# Patient Record
Sex: Male | Born: 1960 | Race: Black or African American | Hispanic: No | Marital: Married | Smoking: Former smoker
Health system: Southern US, Community
[De-identification: ages and names within clinical notes are randomized; demographics above are authoritative.]

## PROBLEM LIST (undated history)

## (undated) DIAGNOSIS — M1990 Unspecified osteoarthritis, unspecified site: Secondary | ICD-10-CM

## (undated) DIAGNOSIS — J189 Pneumonia, unspecified organism: Secondary | ICD-10-CM

## (undated) DIAGNOSIS — I10 Essential (primary) hypertension: Secondary | ICD-10-CM

## (undated) DIAGNOSIS — F329 Major depressive disorder, single episode, unspecified: Secondary | ICD-10-CM

## (undated) DIAGNOSIS — F419 Anxiety disorder, unspecified: Secondary | ICD-10-CM

## (undated) DIAGNOSIS — K219 Gastro-esophageal reflux disease without esophagitis: Secondary | ICD-10-CM

## (undated) DIAGNOSIS — F32A Depression, unspecified: Secondary | ICD-10-CM

## (undated) HISTORY — PX: ANKLE SURGERY: SHX546

## (undated) HISTORY — PX: PELVIC FRACTURE SURGERY: SHX119

---

## 1997-12-09 ENCOUNTER — Encounter: Payer: Self-pay | Admitting: Emergency Medicine

## 1997-12-10 ENCOUNTER — Inpatient Hospital Stay (HOSPITAL_COMMUNITY): Admission: EM | Admit: 1997-12-10 | Discharge: 1997-12-11 | Payer: Self-pay | Admitting: Emergency Medicine

## 1997-12-10 ENCOUNTER — Encounter: Payer: Self-pay | Admitting: Internal Medicine

## 2004-07-03 ENCOUNTER — Ambulatory Visit: Payer: Self-pay | Admitting: Physical Medicine and Rehabilitation

## 2004-07-03 ENCOUNTER — Encounter
Admission: RE | Admit: 2004-07-03 | Discharge: 2004-10-09 | Payer: Self-pay | Admitting: Physical Medicine and Rehabilitation

## 2005-07-30 ENCOUNTER — Inpatient Hospital Stay (HOSPITAL_COMMUNITY): Admission: EM | Admit: 2005-07-30 | Discharge: 2005-08-05 | Payer: Self-pay | Admitting: Emergency Medicine

## 2005-08-03 ENCOUNTER — Ambulatory Visit: Payer: Self-pay | Admitting: Physical Medicine & Rehabilitation

## 2005-08-04 ENCOUNTER — Encounter: Payer: Self-pay | Admitting: Vascular Surgery

## 2005-08-05 ENCOUNTER — Inpatient Hospital Stay (HOSPITAL_COMMUNITY)
Admission: RE | Admit: 2005-08-05 | Discharge: 2005-08-14 | Payer: Self-pay | Admitting: Physical Medicine & Rehabilitation

## 2005-08-15 ENCOUNTER — Emergency Department (HOSPITAL_COMMUNITY): Admission: EM | Admit: 2005-08-15 | Discharge: 2005-08-15 | Payer: Self-pay | Admitting: Emergency Medicine

## 2005-09-01 ENCOUNTER — Encounter
Admission: RE | Admit: 2005-09-01 | Discharge: 2005-11-30 | Payer: Self-pay | Admitting: Physical Medicine & Rehabilitation

## 2005-09-01 ENCOUNTER — Ambulatory Visit: Payer: Self-pay | Admitting: Physical Medicine & Rehabilitation

## 2005-09-16 ENCOUNTER — Encounter: Admission: RE | Admit: 2005-09-16 | Discharge: 2005-10-21 | Payer: Self-pay | Admitting: Orthopedic Surgery

## 2005-10-06 ENCOUNTER — Ambulatory Visit: Payer: Self-pay | Admitting: Physical Medicine & Rehabilitation

## 2005-11-03 ENCOUNTER — Ambulatory Visit: Payer: Self-pay | Admitting: Physical Medicine & Rehabilitation

## 2005-12-01 ENCOUNTER — Encounter
Admission: RE | Admit: 2005-12-01 | Discharge: 2006-03-01 | Payer: Self-pay | Admitting: Physical Medicine & Rehabilitation

## 2005-12-30 ENCOUNTER — Ambulatory Visit: Payer: Self-pay | Admitting: Physical Medicine & Rehabilitation

## 2006-01-28 ENCOUNTER — Encounter
Admission: RE | Admit: 2006-01-28 | Discharge: 2006-04-28 | Payer: Self-pay | Admitting: Physical Medicine & Rehabilitation

## 2006-02-18 ENCOUNTER — Ambulatory Visit: Payer: Self-pay | Admitting: Physical Medicine & Rehabilitation

## 2006-04-13 ENCOUNTER — Ambulatory Visit: Payer: Self-pay | Admitting: Physical Medicine & Rehabilitation

## 2006-05-14 ENCOUNTER — Ambulatory Visit: Payer: Self-pay | Admitting: Physical Medicine & Rehabilitation

## 2006-05-14 ENCOUNTER — Encounter
Admission: RE | Admit: 2006-05-14 | Discharge: 2006-08-12 | Payer: Self-pay | Admitting: Physical Medicine & Rehabilitation

## 2006-06-11 ENCOUNTER — Ambulatory Visit: Payer: Self-pay | Admitting: Physical Medicine & Rehabilitation
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
## 2010-06-13 NOTE — Assessment & Plan Note (Signed)
Ebert is back is regarding his chronic back pain and polytrauma with  sciatic nerve injury.  Shawntez continues to improve.  He is using minimal  morphine sulfate immediate-release now, only taking it at nighttime as  needed.  He is using his MS Contin 60 mg q.12 h.  He is on Lyrica 75 mg  t.i.d.  His pain is 6/10 to 7/10.  It is generally in the low back with  minimal pain into the legs.  Pain is worse with walking and standing for  prolonged periods of time.  Pain generally improves with rest, heat, and  exercise.  The cool weather has tightened his back up a bit.  He uses his  Robaxin on as needed for spasm.   REVIEW OF SYSTEMS:  The patient reports some anxiety.  Denies any new  neurologic, constitutional, GU, GI, or cardiorespiratory complaints,  otherwise.   SOCIAL HISTORY:  Patient is married.  Still smokes 1-1/2 packs of cigarettes  a day.   PHYSICAL EXAMINATION:  VITAL SIGNS:  Blood pressure is 144/74, pulse 95,  respiratory rate 16.  He is satting 95% on room air.  GENERAL:  Patient is pleasant, in no acute distress.  NEUROLOGIC:  He is alert and oriented x3.  Affect is bright and appropriate.  Gait is slightly wide-based but steady.  Coordination is fair.  Reflexes are  1+ in the legs.  Sensation appears to be grossly intact in both legs.  Strength is near 4+/5 to 5/5 in both legs today.  He is a bit limited in  lumbar flexion only to about 45 degrees with tightness.  Extension caused  some discomfort today at about 10-15 degrees.  Rotation and lateral bending  were approximately 20 degrees to either side.  Straigtht leg testing was  negative today.  He was a bit tight still on his upper legs, particularly in  the hamstring musculature.  Mood was good.  No anxiety was appreciated  today.   ASSESSMENT:  1. Polytrauma, including multiple lumbar transverse process fractures,      sciatic nerve injury and pelvic fractures.  2. Anxiety disorder.   PLAN:  1. Continue  tapering MS Contin.  We will decrease the MS Contin to 30 mg      q.12 h. #60 today.  The patient has MSIR remaining at home for      breakthrough pain, which he is using minimally.  2. Begin Naprelan 375 mg 2 tablets daily as anti-inflammatory.  Patient      will take with food.  Observe closely for GI side effects.  3. Lyrica 75 mg t.i.d. for now.  4. Robaxin p.r.n. for spasm.  5. Encourage ongoing stretching, range of motion, and aerobic activity.  6. I will see the patient back in about 2 months' time.  He will see the      nurse in the clinic in 1 month.      Ranelle Oyster, M.D.  Electronically Signed     ZTS/MedQ  D:  12/02/2005 09:28:15  T:  12/02/2005 14:39:37  Job #:  [id number]
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
## 2010-06-13 NOTE — Assessment & Plan Note (Signed)
Jeremiah Lopez is back regarding his low back pain.  He is generally improving  to a great extent with the back.  He still has a lot of pain when he  walks or stands for prolonged periods of time.  The pain seems to be  most prominent in the right low back area above the pelvis.  He rates  the pan as a 9/10 and describes it as sharp, burning, stabbing and  constant.  Pain interferes with general activity, relations with others  and enjoyment of life on a moderate level.  Sleep is fair-to-good.  Pain  improves with his medication as well as heat and ice.   MEDICATIONS:  He remains on MS Contin 30 mg q.12 hours, oxycodone 5 mg 1  to 2 q.6 hours p.r.n.  I have also had him on Naprelan 375 mg 2 daily.  He has Lidoderm patches but uses them sparingly.   REVIEW OF SYSTEMS:  Notable for some weight gain.  Other pertinent  positives are listed above.  His blood pressure has been a little bit  high at times as well as.  Full review is in the written health and  history section.  The patient reports continued erectile dysfunction.   SOCIAL HISTORY:  The patient is married and he and his wife recently  moved to [facility].  He is looking for a pain clinic in  [place] to see him.   PHYSICAL EXAMINATION:  VITAL SIGNS:  Blood pressure is 163/85, pulse is  80, respiratory rate 16, he is sating 99% on room air.  GENERAL:  The patient is generally pleasant and alert and oriented x3.  Weight is generally stable.  He has fair posture.  He had difficulty  bending but was able to flex at the waist near 85 degrees to 90 degrees.  He had problems moving back to extended position.  He had problems with  extension from a neutral position as well as facet maneuver particularly  to the right.  The right flank was tender with palpation near the  transverse processes and perhaps the facets.  Motor function was near  5/5.  Sensory exam was near normal today.  Cognitively he was  appropriate.  No anxiety  was seen.  HEART:  Regular.  CHEST:  Clear.  ABDOMEN:  Soft, nontender.   ASSESSMENT:  1. Polytrauma:  Multiple lumbar transverse process fractures on the      right, sciatic nerve injury and pelvic fractures.  2. Possible facet syndrome in lumbar spine.  3. Anxiety disorder.   PLAN:  1. MS Contin 30 mg q.12 hours, oxycodone 5 mg 1 to 2 q.6 hours p.r.n.  2. Refilled Benicar 25/40 for hypertension.  3. Maintain Lyrica and Robaxin.  Encouraged increased use of Lidoderm      patches for low back as well as regular exercise and activity.  4. Gave the patient a prescription for Viagra 50 mg 1 q. day p.r.n.  5. The patient will seek a pain center in [facility].  I      would be happy to facilitate transfer of his care there as needed.     Ranelle Oyster, M.D.  Electronically Signed    ZTS/MedQ  D:  05/18/2006 10:04:00  T:  05/18/2006 10:36:45  Job #:  [id number]
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
## 2017-06-16 ENCOUNTER — Ambulatory Visit (INDEPENDENT_AMBULATORY_CARE_PROVIDER_SITE_OTHER): Payer: Medicare HMO

## 2017-06-16 ENCOUNTER — Encounter (HOSPITAL_COMMUNITY): Payer: Self-pay | Admitting: Emergency Medicine

## 2017-06-16 ENCOUNTER — Ambulatory Visit (HOSPITAL_COMMUNITY)
Admission: EM | Admit: 2017-06-16 | Discharge: 2017-06-16 | Disposition: A | Discharge status: Home / Self Care | Payer: Medicare HMO | Attending: Family Medicine | Admitting: Family Medicine

## 2017-06-16 DIAGNOSIS — J069 Acute upper respiratory infection, unspecified: Secondary | ICD-10-CM

## 2017-06-16 DIAGNOSIS — H6690 Otitis media, unspecified, unspecified ear: Secondary | ICD-10-CM

## 2017-06-16 HISTORY — DX: Major depressive disorder, single episode, unspecified: F32.9

## 2017-06-16 HISTORY — DX: Essential (primary) hypertension: I10

## 2017-06-16 HISTORY — DX: Depression, unspecified: F32.A

## 2017-06-16 HISTORY — DX: Anxiety disorder, unspecified: F41.9

## 2017-06-16 MED ORDER — ALBUTEROL SULFATE HFA 108 (90 BASE) MCG/ACT IN AERS: 1.0000 | INHALATION_SPRAY | Freq: Four times a day (QID) | RESPIRATORY_TRACT | 0 refills | Status: DC | PRN

## 2017-06-16 MED ORDER — FLUTICASONE PROPIONATE 50 MCG/ACT NA SUSP: 1.0000 | Freq: Every day | NASAL | 0 refills | Status: AC

## 2017-06-16 MED ORDER — AMOXICILLIN 500 MG PO CAPS: 500.0000 mg | ORAL_CAPSULE | Freq: Three times a day (TID) | ORAL | 0 refills | Status: DC

## 2017-06-16 MED ORDER — BENZONATATE 200 MG PO CAPS
200.0000 mg | ORAL_CAPSULE | Freq: Three times a day (TID) | ORAL | 0 refills | Status: DC
Start: 2017-06-16 — End: 2020-11-08

## 2017-06-16 NOTE — ED Triage Notes (Signed)
Pt c/o fever, chills body aches, ear pain, headache, cough for several days.

## 2017-06-16 NOTE — ED Provider Notes (Signed)
[facility]    CSN: [id number] Arrival date & time: 06/16/17  1621     History   Chief Complaint Chief Complaint  Patient presents with  . Fever  . Generalized Body Aches    HPI Jeremiah Lopez is a 57 y.o. male history of hypertension, previous smoker presenting today for evaluation of URI symptoms.  Patient has been having fever, chills, body aches, right ear pain, headache and cough and congestion.  Symptoms have been going on for approximately 3 to 4 days.  Ear feels like it is popped.  States he is feeling similar to when he previously had pneumonia.  Fever up to 100.4.  Taking Alka-Seltzer plus as well as cough drops.  HPI  Past Medical History:  Diagnosis Date  . Anxiety   . Depression   . Hypertension     There are no active problems to display for this patient.   Past Surgical History:  Procedure Laterality Date  . ANKLE SURGERY    . PELVIC FRACTURE SURGERY         Home Medications    Prior to Admission medications   Medication Sig Start Date End Date Taking? Authorizing Provider  alprazolam Prudy Feeler) 2 MG tablet Take 2 mg by mouth at bedtime as needed for sleep.   Yes [provider]  cyclobenzaprine (FLEXERIL) 10 MG tablet Take 10 mg by mouth 3 (three) times daily as needed for muscle spasms.   Yes [provider]  lamoTRIgine (LAMICTAL) 25 MG tablet Take 25 mg by mouth daily.   Yes [provider]  losartan-hydrochlorothiazide (HYZAAR) 100-25 MG tablet Take 1 tablet by mouth daily.   Yes [provider]  albuterol (PROVENTIL HFA;VENTOLIN HFA) 108 (90 Base) MCG/ACT inhaler Inhale 1-2 puffs into the lungs every 6 (six) hours as needed for wheezing or shortness of breath. 06/16/17   Benna Arno C, PA-C  amoxicillin (AMOXIL) 500 MG capsule Take 1 capsule (500 mg total) by mouth 3 (three) times daily for 10 days. 06/16/17 06/26/17  Eathan Groman C, PA-C  benzonatate (TESSALON) 200 MG capsule Take 1 capsule  (200 mg total) by mouth every 8 (eight) hours. 06/16/17   Caro Brundidge C, PA-C  fluticasone (FLONASE) 50 MCG/ACT nasal spray Place 1-2 sprays into both nostrils daily for 7 days. 06/16/17 06/23/17  Micaella Gitto, Junius Creamer, PA-C    Family History Family History  Problem Relation Age of Onset  . Hypertension Mother   . Hypertension Father     Social History Social History   Tobacco Use  . Smoking status: Never Smoker  Substance Use Topics  . Alcohol use: Yes  . Drug use: Not on file     Allergies   Bee venom   Review of Systems Review of Systems  Constitutional: Positive for fatigue and fever. Negative for activity change and appetite change.  HENT: Positive for congestion, ear pain, postnasal drip, rhinorrhea, sinus pressure and sore throat.   Eyes: Negative for pain and itching.  Respiratory: Positive for cough. Negative for shortness of breath.   Cardiovascular: Negative for chest pain.  Gastrointestinal: Negative for abdominal pain, diarrhea, nausea and vomiting.  Musculoskeletal: Negative for myalgias.  Skin: Negative for rash.  Neurological: Positive for headaches. Negative for dizziness and light-headedness.     Physical Exam Triage Vital Signs ED Triage Vitals [06/16/17 1632]  Enc Vitals Group     BP (!) 154/78     Pulse Rate 100     Resp (!) 22  Temp 99.3 F (37.4 C)     Temp src      SpO2 98 %     Weight      Height      Head Circumference      Peak Flow      Pain Score      Pain Loc      Pain Edu?      Excl. in GC?    No data found.  Updated Vital Signs BP (!) 154/78   Pulse 100   Temp 99.3 F (37.4 C)   Resp (!) 22   SpO2 98%   Visual Acuity Right Eye Distance:   Left Eye Distance:   Bilateral Distance:    Right Eye Near:   Left Eye Near:    Bilateral Near:     Physical Exam  Constitutional: He appears well-developed and well-nourished.  HENT:  Head: Normocephalic and atraumatic.  Bilateral ears without tenderness to palpation  of external auricle, tragus and mastoid, EAC's without erythema or swelling; right TM erythematous and dull to superior aspect of the TM, left TM nonerythematous.  Oral mucosa pink and moist, no tonsillar enlargement or exudate. Posterior pharynx patent and erythematous, no uvula deviation or swelling. Normal phonation.   Eyes: Conjunctivae are normal.  Neck: Neck supple.  Cardiovascular: Normal rate and regular rhythm.  No murmur heard. Pulmonary/Chest: Effort normal and breath sounds normal. No respiratory distress.  Breathing comfortably at rest, CTABL, no wheezing, rales or other adventitious sounds auscultated  Abdominal: Soft. There is no tenderness.  Musculoskeletal: He exhibits no edema.  Neurological: He is alert.  Skin: Skin is warm and dry.  Psychiatric: He has a normal mood and affect.  Nursing note and vitals reviewed.    UC Treatments / Results  Labs (all labs ordered are listed, but only abnormal results are displayed) Labs Reviewed - No data to display  EKG None  Radiology Dg Chest 2 View  Result Date: 06/16/2017 CLINICAL DATA:  Cough, fever EXAM: CHEST - 2 VIEW COMPARISON:  04/25/2015 FINDINGS: Peribronchial thickening and interstitial prominence, likely bronchitis. Heart is normal size. No confluent opacities or effusions. No acute bony abnormality. IMPRESSION: Bronchitic changes. Electronically Signed   By: Charlett Nose M.D.   On: 06/16/2017 18:29    Procedures Procedures (including critical care time)  Medications Ordered in UC Medications - No data to display  Initial Impression / Assessment and Plan / UC Course  I have reviewed the triage vital signs and the nursing notes.  Pertinent labs & imaging results that were available during my care of the patient were reviewed by me and considered in my medical decision making (see chart for details).     Chest x-ray negative for pneumonia.  Does show bronchitic changes, likely from previous smoking.   Patient with right otitis media, other symptoms likely viral.  We will go and initiate amoxicillin.  We will add in Flonase for nasal congestion, Tessalon for cough and albuterol inhaler as needed.  Tylenol and ibuprofen for fever.Discussed strict return precautions. Patient verbalized understanding and is agreeable with plan.  Final Clinical Impressions(s) / UC Diagnoses   Final diagnoses:  Viral upper respiratory tract infection  Acute otitis media, unspecified otitis media type     Discharge Instructions     Begin amoxicillin for the next 10 days  Continue zyrtec, add in flonase  Tessalon for cough  Albuterol inhaler as needed for shortness of breath   ED Prescriptions  Medication Sig Dispense Auth. Provider   benzonatate (TESSALON) 200 MG capsule Take 1 capsule (200 mg total) by mouth every 8 (eight) hours. 28 capsule Gabi Mcfate C, PA-C   amoxicillin (AMOXIL) 500 MG capsule Take 1 capsule (500 mg total) by mouth 3 (three) times daily for 10 days. 30 capsule Akeela Busk C, PA-C   albuterol (PROVENTIL HFA;VENTOLIN HFA) 108 (90 Base) MCG/ACT inhaler Inhale 1-2 puffs into the lungs every 6 (six) hours as needed for wheezing or shortness of breath. 1 Inhaler Jessicaann Overbaugh C, PA-C   fluticasone (FLONASE) 50 MCG/ACT nasal spray Place 1-2 sprays into both nostrils daily for 7 days. 1 g Ina Scrivens, [place] C, PA-C     Controlled Substance Prescriptions [place] Controlled Substance Registry consulted? Not Applicable   Lew Dawes, [place] 06/16/17 2301

## 2017-06-16 NOTE — Discharge Instructions (Addendum)
Begin amoxicillin for the next 10 days  Continue zyrtec, add in flonase  Tessalon for cough  Albuterol inhaler as needed for shortness of breath

## 2017-06-18 ENCOUNTER — Emergency Department (HOSPITAL_COMMUNITY): Payer: Medicare HMO

## 2017-06-18 ENCOUNTER — Observation Stay (HOSPITAL_COMMUNITY)
Admission: EM | Admit: 2017-06-18 | Discharge: 2017-06-20 | Disposition: A | Discharge status: Home / Self Care | Payer: Medicare HMO | Attending: Internal Medicine | Admitting: Internal Medicine

## 2017-06-18 ENCOUNTER — Other Ambulatory Visit: Payer: Self-pay

## 2017-06-18 DIAGNOSIS — Z79899 Other long term (current) drug therapy: Secondary | ICD-10-CM | POA: Insufficient documentation

## 2017-06-18 DIAGNOSIS — J189 Pneumonia, unspecified organism: Principal | ICD-10-CM | POA: Insufficient documentation

## 2017-06-18 DIAGNOSIS — Z7951 Long term (current) use of inhaled steroids: Secondary | ICD-10-CM | POA: Diagnosis not present

## 2017-06-18 DIAGNOSIS — I10 Essential (primary) hypertension: Secondary | ICD-10-CM | POA: Insufficient documentation

## 2017-06-18 DIAGNOSIS — Z8249 Family history of ischemic heart disease and other diseases of the circulatory system: Secondary | ICD-10-CM | POA: Insufficient documentation

## 2017-06-18 DIAGNOSIS — R000 Tachycardia, unspecified: Secondary | ICD-10-CM | POA: Insufficient documentation

## 2017-06-18 DIAGNOSIS — Z87891 Personal history of nicotine dependence: Secondary | ICD-10-CM | POA: Insufficient documentation

## 2017-06-18 DIAGNOSIS — R0602 Shortness of breath: Secondary | ICD-10-CM | POA: Diagnosis present

## 2017-06-18 DIAGNOSIS — F329 Major depressive disorder, single episode, unspecified: Secondary | ICD-10-CM | POA: Diagnosis not present

## 2017-06-18 DIAGNOSIS — Z91030 Bee allergy status: Secondary | ICD-10-CM | POA: Insufficient documentation

## 2017-06-18 DIAGNOSIS — R918 Other nonspecific abnormal finding of lung field: Secondary | ICD-10-CM | POA: Insufficient documentation

## 2017-06-18 DIAGNOSIS — Z6841 Body Mass Index (BMI) 40.0 and over, adult: Secondary | ICD-10-CM | POA: Diagnosis not present

## 2017-06-18 DIAGNOSIS — F419 Anxiety disorder, unspecified: Secondary | ICD-10-CM | POA: Diagnosis present

## 2017-06-18 DIAGNOSIS — Z9889 Other specified postprocedural states: Secondary | ICD-10-CM | POA: Insufficient documentation

## 2017-06-18 DIAGNOSIS — N179 Acute kidney failure, unspecified: Secondary | ICD-10-CM | POA: Diagnosis not present

## 2017-06-18 DIAGNOSIS — R05 Cough: Secondary | ICD-10-CM

## 2017-06-18 DIAGNOSIS — Z7982 Long term (current) use of aspirin: Secondary | ICD-10-CM | POA: Diagnosis not present

## 2017-06-18 DIAGNOSIS — N289 Disorder of kidney and ureter, unspecified: Secondary | ICD-10-CM

## 2017-06-18 DIAGNOSIS — R059 Cough, unspecified: Secondary | ICD-10-CM

## 2017-06-18 LAB — CBC
HEMATOCRIT: 39.6 % (ref 39.0–52.0)
HEMOGLOBIN: 13.4 g/dL (ref 13.0–17.0)
MCH: 30 pg (ref 26.0–34.0)
MCHC: 33.8 g/dL (ref 30.0–36.0)
MCV: 88.6 fL (ref 78.0–100.0)
Platelets: 251 10*3/uL (ref 150–400)
RBC: 4.47 MIL/uL (ref 4.22–5.81)
RDW: 12.3 % (ref 11.5–15.5)
WBC: 9.6 10*3/uL (ref 4.0–10.5)

## 2017-06-18 LAB — BASIC METABOLIC PANEL
ANION GAP: 9 (ref 5–15)
BUN: 9 mg/dL (ref 6–20)
CHLORIDE: 104 mmol/L (ref 101–111)
CO2: 24 mmol/L (ref 22–32)
Calcium: 8.7 mg/dL — ABNORMAL LOW (ref 8.9–10.3)
Creatinine, Ser: 1.4 mg/dL — ABNORMAL HIGH (ref 0.61–1.24)
GFR calc Af Amer: >60 mL/min (ref >60)
GFR, EST NON AFRICAN AMERICAN: 54 mL/min — AB (ref >60)
GLUCOSE: 170 mg/dL — AB (ref 65–99)
POTASSIUM: 3.7 mmol/L (ref 3.5–5.1)
Sodium: 137 mmol/L (ref 135–145)

## 2017-06-18 LAB — URINALYSIS, ROUTINE W REFLEX MICROSCOPIC
BACTERIA UA: NONE SEEN
Bilirubin Urine: NEGATIVE
Glucose, UA: NEGATIVE mg/dL
Hgb urine dipstick: NEGATIVE
KETONES UR: NEGATIVE mg/dL
LEUKOCYTES UA: NEGATIVE
Nitrite: NEGATIVE
PH: 7 (ref 5.0–8.0)
Protein, ur: 100 mg/dL — AB
Specific Gravity, Urine: 1.028 (ref 1.005–1.030)

## 2017-06-18 LAB — I-STAT CG4 LACTIC ACID, ED: Lactic Acid, Venous: 2.11 mmol/L (ref 0.5–1.9)

## 2017-06-18 LAB — I-STAT TROPONIN, ED: Troponin i, poc: 0 ng/mL (ref 0.00–0.08)

## 2017-06-18 MED ORDER — IPRATROPIUM BROMIDE 0.02 % IN SOLN
0.5000 mg | Freq: Once | RESPIRATORY_TRACT | Status: AC
  Administered 2017-06-18: 0.5 mg via RESPIRATORY_TRACT
  Filled 2017-06-18: qty 2.5

## 2017-06-18 MED ORDER — SODIUM CHLORIDE 0.9 % IV BOLUS
1000.0000 mL | Freq: Once | INTRAVENOUS | Status: AC
  Administered 2017-06-18: 1000 mL via INTRAVENOUS

## 2017-06-18 MED ORDER — ALBUTEROL SULFATE (2.5 MG/3ML) 0.083% IN NEBU
5.0000 mg | INHALATION_SOLUTION | Freq: Once | RESPIRATORY_TRACT | Status: AC
  Administered 2017-06-18: 5 mg via RESPIRATORY_TRACT
  Filled 2017-06-18: qty 6

## 2017-06-18 NOTE — ED Triage Notes (Signed)
Patient c/o shortness of breath and cough since last week. Was seen at Urgent Care on 05/22 and was given abx which he has been taking but is feeling no relief.

## 2017-06-18 NOTE — ED Notes (Signed)
Patient transported to X-ray 

## 2017-06-18 NOTE — ED Provider Notes (Signed)
[facility] EMERGENCY DEPARTMENT Provider Note   CSN: [id number] Arrival date & time: 06/18/17  2149     History   Chief Complaint Chief Complaint  Patient presents with  . Shortness of Breath    HPI Jeremiah Lopez is a 57 y.o. male.  Patient with history of hypertension presents to the emergency department with complaint of cough and shortness of breath for the past 1 week.  Patient has had intermittent fevers as high as 102 F at home.  He has chest pain with coughing.  He has had some nasal congestion and ear popping sensation.  He was seen at urgent care 2 days ago had an x-ray which demonstrated bronchitis.  Patient was treated with Tessalon, amoxicillin, albuterol.  He states that these have not helped very much.  He continues to be short of breath, more so when he is active. Patient denies risk factors for pulmonary embolism including: unilateral leg swelling, history of DVT/PE/other blood clots, use of exogenous hormones, recent immobilizations, recent surgery, recent travel (>4hr segment), malignancy, hemoptysis.       Past Medical History:  Diagnosis Date  . Anxiety   . Depression   . Hypertension     There are no active problems to display for this patient.   Past Surgical History:  Procedure Laterality Date  . ANKLE SURGERY    . PELVIC FRACTURE SURGERY          Home Medications    Prior to Admission medications   Medication Sig Start Date End Date Taking? Authorizing Provider  albuterol (PROVENTIL HFA;VENTOLIN HFA) 108 (90 Base) MCG/ACT inhaler Inhale 1-2 puffs into the lungs every 6 (six) hours as needed for wheezing or shortness of breath. 06/16/17   Wieters, Hallie C, PA-C  alprazolam (XANAX) 2 MG tablet Take 2 mg by mouth at bedtime as needed for sleep.    [provider]  amoxicillin (AMOXIL) 500 MG capsule Take 1 capsule (500 mg total) by mouth 3 (three) times daily for 10 days. 06/16/17 06/26/17  Wieters, Hallie C, PA-C    benzonatate (TESSALON) 200 MG capsule Take 1 capsule (200 mg total) by mouth every 8 (eight) hours. 06/16/17   Wieters, Hallie C, PA-C  cyclobenzaprine (FLEXERIL) 10 MG tablet Take 10 mg by mouth 3 (three) times daily as needed for muscle spasms.    [provider]  fluticasone (FLONASE) 50 MCG/ACT nasal spray Place 1-2 sprays into both nostrils daily for 7 days. 06/16/17 06/23/17  Wieters, Hallie C, PA-C  lamoTRIgine (LAMICTAL) 25 MG tablet Take 25 mg by mouth daily.    [provider]  losartan-hydrochlorothiazide (HYZAAR) 100-25 MG tablet Take 1 tablet by mouth daily.    [provider]    Family History Family History  Problem Relation Age of Onset  . Hypertension Mother   . Hypertension Father     Social History Social History   Tobacco Use  . Smoking status: Never Smoker  Substance Use Topics  . Alcohol use: Yes  . Drug use: Not on file     Allergies   Bee venom   Review of Systems Review of Systems  Constitutional: Positive for chills, fatigue and fever.  HENT: Positive for congestion and ear pain. Negative for rhinorrhea and sore throat.   Eyes: Negative for redness.  Respiratory: Positive for cough and shortness of breath.   Cardiovascular: Positive for chest pain (with cough).  Gastrointestinal: Negative for abdominal pain, diarrhea, nausea and vomiting.  Genitourinary:  Negative for dysuria.  Musculoskeletal: Positive for myalgias.  Skin: Negative for rash.  Neurological: Negative for headaches.     Physical Exam Updated Vital Signs BP (!) 151/101 (BP Location: Right Arm)   Pulse (!) 115   Temp 99.1 F (37.3 C) (Oral)   Resp (!) 22   Ht 6' (1.829 m)   Wt (!) 147 kg (324 lb)   SpO2 95%   BMI 43.94 kg/m   Physical Exam  Constitutional: He appears well-developed and well-nourished.  HENT:  Head: Normocephalic and atraumatic.  Right Ear: Hearing, external ear and ear canal normal.  Left Ear: Hearing, external ear and ear  canal normal.  Nose: No mucosal edema.  Mouth/Throat: Uvula is midline, oropharynx is clear and moist and mucous membranes are normal.  Eyes: Conjunctivae are normal. Right eye exhibits no discharge. Left eye exhibits no discharge.  Neck: Normal range of motion. Neck supple.  Cardiovascular: Regular rhythm and normal heart sounds. Tachycardia present.  Pulmonary/Chest: Effort normal and breath sounds normal. He has no decreased breath sounds. He has no wheezes. He has no rhonchi. He has no rales.  Coughing during exam.   Abdominal: Soft. There is no tenderness. There is no rebound and no guarding.  Musculoskeletal: He exhibits no edema or tenderness.  Neurological: He is alert.  Skin: Skin is warm and dry.  Psychiatric: He has a normal mood and affect.  Nursing note and vitals reviewed.    ED Treatments / Results  Labs (all labs ordered are listed, but only abnormal results are displayed) Labs Reviewed  URINALYSIS, ROUTINE W REFLEX MICROSCOPIC - Abnormal; Notable for the following components:      Result Value   Protein, ur 100 (*)    All other components within normal limits  BASIC METABOLIC PANEL - Abnormal; Notable for the following components:   Glucose, Bld 170 (*)    Creatinine, Ser 1.40 (*)    Calcium 8.7 (*)    GFR calc non Af Amer 54 (*)    All other components within normal limits  D-DIMER, QUANTITATIVE (NOT AT [facility]) - Abnormal; Notable for the following components:   D-Dimer, Quant 0.92 (*)    All other components within normal limits  I-STAT CG4 LACTIC ACID, ED - Abnormal; Notable for the following components:   Lactic Acid, Venous 2.11 (*)    All other components within normal limits  I-STAT CG4 LACTIC ACID, ED - Abnormal; Notable for the following components:   Lactic Acid, Venous 2.40 (*)    All other components within normal limits  CBC  BRAIN NATRIURETIC PEPTIDE  I-STAT TROPONIN, ED    EKG EKG Interpretation  Date/Time:  Friday Jun 18 2017 22:20:40  EDT Ventricular Rate:  114 PR Interval:  126 QRS Duration: 86 QT Interval:  326 QTC Calculation: 449 R Axis:   47 Text Interpretation:  Sinus tachycardia Otherwise normal ECG Since last tracing rate faster Confirmed by Jacalyn Lefevre [phone]) on 06/18/2017 10:45:40 PM   Radiology Dg Chest 2 View  Result Date: 06/18/2017 CLINICAL DATA:  Dyspnea and productive cough EXAM: CHEST - 2 VIEW COMPARISON:  06/16/2017 FINDINGS: Heart and mediastinal contours are normal. Mild peribronchial thickening with interstitial prominence compatible with chronic bronchitic change. Mild central vascular congestion is also noted without effusion or pneumothorax. No acute osseous abnormality. IMPRESSION: Peribronchial thickening and increased interstitial lung markings compatible with chronic bronchitic change. Mild vascular congestion noted Electronically Signed   By: Tollie Eth M.D.   On: 06/18/2017 23:24  Procedures Procedures (including critical care time)  Medications Ordered in ED Medications  sodium chloride 0.9 % bolus 1,000 mL (has no administration in time range)  albuterol (PROVENTIL) (2.5 MG/3ML) 0.083% nebulizer solution 5 mg (has no administration in time range)  ipratropium (ATROVENT) nebulizer solution 0.5 mg (has no administration in time range)     Initial Impression / Assessment and Plan / ED Course  I have reviewed the triage vital signs and the nursing notes.  Pertinent labs & imaging results that were available during my care of the patient were reviewed by me and considered in my medical decision making (see chart for details).     Patient seen and examined. Work-up initiated. Medications ordered. Fluids ordered with elevated lactate. CXR viewed by myself.   Vital signs reviewed and are as follows: BP (!) 151/101 (BP Location: Right Arm)   Pulse (!) 115   Temp 99.1 F (37.3 C) (Oral)   Resp (!) 22   Ht 6' (1.829 m)   Wt (!) 147 kg (324 lb)   SpO2 95%   BMI 43.94 kg/m    11:30 PM chest x-ray with chronic bronchitic changes and some vascular congestion.  Patient continues to have coughing.  He does not have any compelling wheezing or fever here and remains mildly tachycardic.  Will treat with albuterol and Atrovent.  Will check BNP and d-dimer.  12:44 AM D-dimer elevated. Pt has received breathing tx and states he feels a bit better.   CT angio pending. Lactate slightly higher. I do not suspect sepsis. Pending BNP.   Handoff to [employer] at shift change.   Plan: CT angio, f/u BNP. If okay, patient can go home with continued tx for bronchitis and PCP f/u.    Final Clinical Impressions(s) / ED Diagnoses   Final diagnoses:  Shortness of breath  Tachycardia  Cough   Pt with 2nd visit for above. He has cough and bronchitis sx. He reports fever. Normal WBC. Further work-up 2/2 tachycardia. Completion of work-up as above.    ED Discharge Orders    None       Renne Crigler, Cordelia Poche 06/19/17 1505    Jacalyn Lefevre, MD 06/19/17 [phone]

## 2017-06-19 ENCOUNTER — Encounter (HOSPITAL_COMMUNITY): Payer: Self-pay | Admitting: Family Medicine

## 2017-06-19 ENCOUNTER — Emergency Department (HOSPITAL_COMMUNITY): Payer: Medicare HMO

## 2017-06-19 DIAGNOSIS — I10 Essential (primary) hypertension: Secondary | ICD-10-CM | POA: Diagnosis not present

## 2017-06-19 DIAGNOSIS — N289 Disorder of kidney and ureter, unspecified: Secondary | ICD-10-CM

## 2017-06-19 DIAGNOSIS — J189 Pneumonia, unspecified organism: Secondary | ICD-10-CM | POA: Diagnosis present

## 2017-06-19 DIAGNOSIS — F419 Anxiety disorder, unspecified: Secondary | ICD-10-CM | POA: Diagnosis not present

## 2017-06-19 LAB — EXPECTORATED SPUTUM ASSESSMENT W REFEX TO RESP CULTURE: SPECIAL REQUESTS: NORMAL

## 2017-06-19 LAB — CBC WITH DIFFERENTIAL/PLATELET
Abs Immature Granulocytes: 0 10*3/uL (ref 0.0–0.1)
BASOS PCT: 0 %
Basophils Absolute: 0 10*3/uL (ref 0.0–0.1)
EOS ABS: 0.2 10*3/uL (ref 0.0–0.7)
EOS PCT: 2 %
HEMATOCRIT: 35.6 % — AB (ref 39.0–52.0)
Hemoglobin: 12.1 g/dL — ABNORMAL LOW (ref 13.0–17.0)
Immature Granulocytes: 0 %
LYMPHS ABS: 1.9 10*3/uL (ref 0.7–4.0)
Lymphocytes Relative: 20 %
MCH: 30.3 pg (ref 26.0–34.0)
MCHC: 34 g/dL (ref 30.0–36.0)
MCV: 89.2 fL (ref 78.0–100.0)
MONOS PCT: 14 %
Monocytes Absolute: 1.3 10*3/uL — ABNORMAL HIGH (ref 0.1–1.0)
Neutro Abs: 6 10*3/uL (ref 1.7–7.7)
Neutrophils Relative %: 64 %
Platelets: 234 10*3/uL (ref 150–400)
RBC: 3.99 MIL/uL — ABNORMAL LOW (ref 4.22–5.81)
RDW: 12.3 % (ref 11.5–15.5)
WBC: 9.5 10*3/uL (ref 4.0–10.5)

## 2017-06-19 LAB — BASIC METABOLIC PANEL
Anion gap: 11 (ref 5–15)
BUN: 9 mg/dL (ref 6–20)
CALCIUM: 8 mg/dL — AB (ref 8.9–10.3)
CHLORIDE: 104 mmol/L (ref 101–111)
CO2: 20 mmol/L — ABNORMAL LOW (ref 22–32)
CREATININE: 1.28 mg/dL — AB (ref 0.61–1.24)
GFR calc Af Amer: >60 mL/min (ref >60)
GFR calc non Af Amer: >60 mL/min (ref >60)
Glucose, Bld: 141 mg/dL — ABNORMAL HIGH (ref 65–99)
Potassium: 3.2 mmol/L — ABNORMAL LOW (ref 3.5–5.1)
Sodium: 135 mmol/L (ref 135–145)

## 2017-06-19 LAB — LACTIC ACID, PLASMA
Lactic Acid, Venous: 1.5 mmol/L (ref 0.5–1.9)
Lactic Acid, Venous: 1.9 mmol/L (ref 0.5–1.9)

## 2017-06-19 LAB — STREP PNEUMONIAE URINARY ANTIGEN: Strep Pneumo Urinary Antigen: NEGATIVE

## 2017-06-19 LAB — BRAIN NATRIURETIC PEPTIDE: B NATRIURETIC PEPTIDE 5: 81.7 pg/mL (ref 0.0–100.0)

## 2017-06-19 LAB — I-STAT CG4 LACTIC ACID, ED: LACTIC ACID, VENOUS: 2.4 mmol/L — AB (ref 0.5–1.9)

## 2017-06-19 LAB — HIV ANTIBODY (ROUTINE TESTING W REFLEX): HIV Screen 4th Generation wRfx: NONREACTIVE

## 2017-06-19 LAB — D-DIMER, QUANTITATIVE: D-Dimer, Quant: 0.92 ug/mL-FEU — ABNORMAL HIGH (ref 0.00–0.50)

## 2017-06-19 LAB — EXPECTORATED SPUTUM ASSESSMENT W GRAM STAIN, RFLX TO RESP C

## 2017-06-19 MED ORDER — ONDANSETRON HCL 4 MG PO TABS: 4.0000 mg | ORAL_TABLET | Freq: Four times a day (QID) | ORAL | Status: DC | PRN

## 2017-06-19 MED ORDER — SENNOSIDES-DOCUSATE SODIUM 8.6-50 MG PO TABS: 1.0000 | ORAL_TABLET | Freq: Every evening | ORAL | Status: DC | PRN

## 2017-06-19 MED ORDER — CEFTRIAXONE SODIUM 1 G IJ SOLR
1.0000 g | Freq: Once | INTRAMUSCULAR | Status: AC
  Administered 2017-06-19: 1 g via INTRAVENOUS
  Filled 2017-06-19: qty 10

## 2017-06-19 MED ORDER — SODIUM CHLORIDE 0.9 % IV BOLUS
500.0000 mL | Freq: Once | INTRAVENOUS | Status: AC
  Administered 2017-06-19: 500 mL via INTRAVENOUS

## 2017-06-19 MED ORDER — FLUTICASONE PROPIONATE 50 MCG/ACT NA SUSP
2.0000 | Freq: Every day | NASAL | Status: DC
  Administered 2017-06-20: 2 via NASAL
  Filled 2017-06-19: qty 16

## 2017-06-19 MED ORDER — ENOXAPARIN SODIUM 40 MG/0.4ML ~~LOC~~ SOLN
40.0000 mg | Freq: Every day | SUBCUTANEOUS | Status: DC
  Administered 2017-06-19: 40 mg via SUBCUTANEOUS
  Filled 2017-06-19 (×2): qty 0.4

## 2017-06-19 MED ORDER — POTASSIUM CHLORIDE CRYS ER 20 MEQ PO TBCR
40.0000 meq | EXTENDED_RELEASE_TABLET | Freq: Once | ORAL | Status: AC
  Administered 2017-06-19: 40 meq via ORAL
  Filled 2017-06-19: qty 2

## 2017-06-19 MED ORDER — IOPAMIDOL (ISOVUE-370) INJECTION 76%
INTRAVENOUS | Status: AC
  Filled 2017-06-19: qty 100

## 2017-06-19 MED ORDER — IOPAMIDOL (ISOVUE-370) INJECTION 76%
100.0000 mL | Freq: Once | INTRAVENOUS | Status: AC | PRN
  Administered 2017-06-19: 100 mL via INTRAVENOUS

## 2017-06-19 MED ORDER — LATANOPROST 0.005 % OP SOLN
1.0000 [drp] | Freq: Every day | OPHTHALMIC | Status: DC
  Administered 2017-06-19: 1 [drp] via OPHTHALMIC
  Filled 2017-06-19: qty 2.5

## 2017-06-19 MED ORDER — CYCLOBENZAPRINE HCL 10 MG PO TABS: 10.0000 mg | ORAL_TABLET | Freq: Three times a day (TID) | ORAL | Status: DC | PRN

## 2017-06-19 MED ORDER — ALBUTEROL SULFATE (2.5 MG/3ML) 0.083% IN NEBU: 2.5000 mg | INHALATION_SOLUTION | Freq: Four times a day (QID) | RESPIRATORY_TRACT | Status: DC | PRN

## 2017-06-19 MED ORDER — SODIUM CHLORIDE 0.9% FLUSH
3.0000 mL | Freq: Two times a day (BID) | INTRAVENOUS | Status: DC
  Administered 2017-06-19 (×2): 3 mL via INTRAVENOUS

## 2017-06-19 MED ORDER — HYDROCODONE-ACETAMINOPHEN 5-325 MG PO TABS: 1.0000 | ORAL_TABLET | ORAL | Status: DC | PRN

## 2017-06-19 MED ORDER — SODIUM CHLORIDE 0.9 % IV SOLN
1.0000 g | INTRAVENOUS | Status: DC
  Administered 2017-06-20: 1 g via INTRAVENOUS
  Filled 2017-06-19: qty 10

## 2017-06-19 MED ORDER — ONDANSETRON HCL 4 MG/2ML IJ SOLN: 4.0000 mg | Freq: Four times a day (QID) | INTRAMUSCULAR | Status: DC | PRN

## 2017-06-19 MED ORDER — GUAIFENESIN ER 600 MG PO TB12
600.0000 mg | ORAL_TABLET | Freq: Two times a day (BID) | ORAL | Status: DC
  Administered 2017-06-19 – 2017-06-20 (×3): 600 mg via ORAL
  Filled 2017-06-19 (×3): qty 1

## 2017-06-19 MED ORDER — ACETAMINOPHEN 650 MG RE SUPP: 650.0000 mg | Freq: Four times a day (QID) | RECTAL | Status: DC | PRN

## 2017-06-19 MED ORDER — SODIUM CHLORIDE 0.9 % IV SOLN
INTRAVENOUS | Status: AC
  Administered 2017-06-19: 05:00:00 via INTRAVENOUS

## 2017-06-19 MED ORDER — ALPRAZOLAM 0.5 MG PO TABS: 2.0000 mg | ORAL_TABLET | Freq: Every evening | ORAL | Status: DC | PRN

## 2017-06-19 MED ORDER — BENZONATATE 100 MG PO CAPS
200.0000 mg | ORAL_CAPSULE | ORAL | Status: DC | PRN
  Administered 2017-06-19 (×2): 200 mg via ORAL
  Filled 2017-06-19 (×2): qty 2

## 2017-06-19 MED ORDER — ASPIRIN EC 81 MG PO TBEC
81.0000 mg | DELAYED_RELEASE_TABLET | ORAL | Status: DC
  Administered 2017-06-19 – 2017-06-20 (×2): 81 mg via ORAL
  Filled 2017-06-19 (×3): qty 1

## 2017-06-19 MED ORDER — ACETAMINOPHEN 325 MG PO TABS
650.0000 mg | ORAL_TABLET | Freq: Four times a day (QID) | ORAL | Status: DC | PRN
  Administered 2017-06-19 – 2017-06-20 (×2): 650 mg via ORAL
  Filled 2017-06-19 (×2): qty 2

## 2017-06-19 MED ORDER — SODIUM CHLORIDE 0.9 % IV SOLN
500.0000 mg | INTRAVENOUS | Status: DC
  Administered 2017-06-20: 500 mg via INTRAVENOUS
  Filled 2017-06-19: qty 500

## 2017-06-19 MED ORDER — SODIUM CHLORIDE 0.9 % IV SOLN
500.0000 mg | Freq: Once | INTRAVENOUS | Status: AC
  Administered 2017-06-19: 500 mg via INTRAVENOUS
  Filled 2017-06-19: qty 500

## 2017-06-19 MED ORDER — OXYMETAZOLINE HCL 0.05 % NA SOLN
1.0000 | Freq: Two times a day (BID) | NASAL | Status: DC
  Administered 2017-06-20: 1 via NASAL
  Filled 2017-06-19: qty 15

## 2017-06-19 MED ORDER — LORATADINE 10 MG PO TABS
10.0000 mg | ORAL_TABLET | Freq: Every day | ORAL | Status: DC
  Administered 2017-06-20: 10 mg via ORAL
  Filled 2017-06-19: qty 1

## 2017-06-19 MED ORDER — BISACODYL 5 MG PO TBEC: 5.0000 mg | DELAYED_RELEASE_TABLET | Freq: Every day | ORAL | Status: DC | PRN

## 2017-06-19 NOTE — H&P (Signed)
History and Physical    Jeremiah Lopez ZOX:[id number] DOB: Oct 30, 1960 DOA: 06/18/2017  PCP: Ranae Pila, FNP   Patient coming from: Home   Chief Complaint: Cough, fever, chills, SOB   HPI: Jeremiah Lopez is a 57 y.o. male with medical history significant for hypertension and anxiety, now presenting to the emergency department for evaluation of fevers, chills, cough, and shortness of breath.  Patient reports that his symptoms developed approximately 1 week ago, at which time he is also having pain in the bilateral ears and sinus congestion.  He was evaluated in an urgent care on 06/16/2017, diagnosed with viral URI and right AOM, prescribed albuterol and amoxicillin, experienced improvement in his ear pain and upper respiratory symptoms, but his unfortunately continued to have fevers and progressive dyspnea with productive cough.  He denies chest pain, denies leg swelling or tenderness, and denies abdominal pain, vomiting, or diarrhea.  He denies any history of asthma or COPD.  ED Course: Upon arrival to the ED, patient is found to be afebrile, saturating adequately on room air, tachypneic, tachycardic in the 130s, and with stable blood pressure.  EKG features a sinus tachycardia with rate 114 and chest x-ray is notable for peribronchial thickening and increased interstitial markings.  Chemistry panel is notable for creatinine of 1.40, up from 1.2 a year ago.  CBC is unremarkable.  Troponin is undetectable, BNP is normal, urinalysis unremarkable, and lactic acid elevated to 2.4.  D-dimer is elevated to 0.92 and CTA chest was performed, negative for PE but concerning for multifocal pneumonia.  Patient was given 1.5 L normal saline, duo nebs, Rocephin, and azithromycin in the ED.  He reports some subjective improvement, but continues to be tachycardic and dyspneic at rest.  He will be admitted for ongoing evaluation and management of multifocal pneumonia.  Review of Systems:  All other systems  reviewed and apart from HPI, are negative.  Past Medical History:  Diagnosis Date  . Anxiety   . Depression   . Hypertension     Past Surgical History:  Procedure Laterality Date  . ANKLE SURGERY    . PELVIC FRACTURE SURGERY       reports that he has quit smoking. He has never used smokeless tobacco. He reports that he drinks alcohol. He reports that he does not use drugs.  Allergies  Allergen Reactions  . Bee Venom     Family History  Problem Relation Age of Onset  . Hypertension Mother   . Hypertension Father      Prior to Admission medications   Medication Sig Start Date End Date Taking? Authorizing Provider  albuterol (PROVENTIL HFA;VENTOLIN HFA) 108 (90 Base) MCG/ACT inhaler Inhale 1-2 puffs into the lungs every 6 (six) hours as needed for wheezing or shortness of breath. 06/16/17  Yes Wieters, Hallie C, PA-C  alprazolam (XANAX) 2 MG tablet Take 2 mg by mouth at bedtime as needed for sleep.   Yes [provider]  amoxicillin (AMOXIL) 500 MG capsule Take 1 capsule (500 mg total) by mouth 3 (three) times daily for 10 days. 06/16/17 06/26/17 Yes Wieters, Hallie C, PA-C  aspirin EC 81 MG tablet Take 81 mg by mouth every morning.   Yes [provider]  cyclobenzaprine (FLEXERIL) 10 MG tablet Take 10 mg by mouth 3 (three) times daily as needed for muscle spasms.   Yes [provider]  fluticasone (FLONASE) 50 MCG/ACT nasal spray Place 1-2 sprays into both nostrils daily for 7 days. 06/16/17 06/23/17 Yes  Wieters, Hallie C, PA-C  ibuprofen (ADVIL,MOTRIN) 200 MG tablet Take 400 mg by mouth every 6 (six) hours as needed for fever, headache or moderate pain.   Yes [provider]  losartan-hydrochlorothiazide (HYZAAR) 100-25 MG tablet Take 1 tablet by mouth daily.   Yes [provider]  LUMIGAN 0.01 % SOLN Place 1 drop into both eyes at bedtime. 06/15/17  Yes [provider]  pantoprazole (PROTONIX) 40 MG tablet Take 40 mg by mouth  daily as needed for heartburn. 06/12/17  Yes [provider]  benzonatate (TESSALON) 200 MG capsule Take 1 capsule (200 mg total) by mouth every 8 (eight) hours. Patient not taking: Reported on 06/18/2017 06/16/17   Lew Dawes, PA-C    Physical Exam: Vitals:   06/19/17 0015 06/19/17 0030 06/19/17 0115 06/19/17 0200  BP: (!) 159/68 134/67 119/68 (!) 149/76  Pulse: (!) 126 (!) 134 (!) 121 (!) 112  Resp: 12 (!) 21    Temp:      TempSrc:      SpO2: 99% 97% 97% 96%  Weight:      Height:          Constitutional: NAD, calm  Eyes: PERTLA, lids normal. Mild conjunctival injection bilaterally.  ENMT: Mucous membranes are moist. Posterior pharynx clear of any exudate or lesions.   Neck: normal, supple, no masses, no thyromegaly Respiratory: Mild tachypnea, dyspnea with speech, rhonchi at bases. No pallor. No accessory muscle use.  Cardiovascular: Rate ~120 and regular. No extremity edema. Abdomen: No distension, no tenderness, soft. Bowel sounds normal.  Musculoskeletal: no clubbing / cyanosis. No joint deformity upper and lower extremities.   Skin: no significant rashes, lesions, ulcers. Warm, dry, well-perfused. Neurologic: CN 2-12 grossly intact. Sensation intact. Strength 5/5 in all 4 limbs.  Psychiatric: Alert and oriented x 3. Pleasant and cooperative.     Labs on Admission: I have personally reviewed following labs and imaging studies  CBC: Recent Labs  Lab 06/18/17 2216  WBC 9.6  HGB 13.4  HCT 39.6  MCV 88.6  PLT 251   Basic Metabolic Panel: Recent Labs  Lab 06/18/17 2216  NA 137  K 3.7  CL 104  CO2 24  GLUCOSE 170*  BUN 9  CREATININE 1.40*  CALCIUM 8.7*   GFR: Estimated Creatinine Clearance: 86.8 mL/min (A) (by C-G formula based on SCr of 1.4 mg/dL (H)). Liver Function Tests: No results for input(s): AST, ALT, ALKPHOS, BILITOT, PROT, ALBUMIN in the last 168 hours. No results for input(s): LIPASE, AMYLASE in the last 168 hours. No results for  input(s): AMMONIA in the last 168 hours. Coagulation Profile: No results for input(s): INR, PROTIME in the last 168 hours. Cardiac Enzymes: No results for input(s): CKTOTAL, CKMB, CKMBINDEX, TROPONINI in the last 168 hours. BNP (last 3 results) No results for input(s): PROBNP in the last 8760 hours. HbA1C: No results for input(s): HGBA1C in the last 72 hours. CBG: No results for input(s): GLUCAP in the last 168 hours. Lipid Profile: No results for input(s): CHOL, HDL, LDLCALC, TRIG, CHOLHDL, LDLDIRECT in the last 72 hours. Thyroid Function Tests: No results for input(s): TSH, T4TOTAL, FREET4, T3FREE, THYROIDAB in the last 72 hours. Anemia Panel: No results for input(s): VITAMINB12, FOLATE, FERRITIN, TIBC, IRON, RETICCTPCT in the last 72 hours. Urine analysis:    Component Value Date/Time   COLORURINE YELLOW 06/18/2017 2208   APPEARANCEUR CLEAR 06/18/2017 2208   LABSPEC 1.028 06/18/2017 2208   PHURINE 7.0 06/18/2017 2208   GLUCOSEU NEGATIVE 06/18/2017  2208   HGBUR NEGATIVE 06/18/2017 2208   BILIRUBINUR NEGATIVE 06/18/2017 2208   KETONESUR NEGATIVE 06/18/2017 2208   PROTEINUR 100 (A) 06/18/2017 2208   NITRITE NEGATIVE 06/18/2017 2208   LEUKOCYTESUR NEGATIVE 06/18/2017 2208   Sepsis Labs: (procalcitonin:4,lacticidven:4) )No results found for this or any previous visit (from the past 240 hour(s)).   Radiological Exams on Admission: Dg Chest 2 View  Result Date: 06/18/2017 CLINICAL DATA:  Dyspnea and productive cough EXAM: CHEST - 2 VIEW COMPARISON:  06/16/2017 FINDINGS: Heart and mediastinal contours are normal. Mild peribronchial thickening with interstitial prominence compatible with chronic bronchitic change. Mild central vascular congestion is also noted without effusion or pneumothorax. No acute osseous abnormality. IMPRESSION: Peribronchial thickening and increased interstitial lung markings compatible with chronic bronchitic change. Mild vascular congestion noted  Electronically Signed   By: Tollie Eth M.D.   On: 06/18/2017 23:24   Ct Angio Chest Pe W And/or Wo Contrast  Result Date: 06/19/2017 CLINICAL DATA:  Shortness of breath and cough EXAM: CT ANGIOGRAPHY CHEST WITH CONTRAST TECHNIQUE: Multidetector CT imaging of the chest was performed using the standard protocol during bolus administration of intravenous contrast. Multiplanar CT image reconstructions and MIPs were obtained to evaluate the vascular anatomy. CONTRAST:  ISOVUE-370 IOPAMIDOL (ISOVUE-370) INJECTION 76% COMPARISON:  Chest radiograph 06/18/2017 FINDINGS: Cardiovascular: --Pulmonary arteries: Contrast injection is sufficient to demonstrate satisfactory opacification of the pulmonary arteries to the segmental level. There is no pulmonary embolus. The main pulmonary artery is within normal limits for size. --Aorta: Limited opacification of the aorta due to bolus timing optimization for the pulmonary arteries. Conventional 3 vessel aortic branching pattern. The aortic course and caliber are normal. There is no aortic atherosclerosis. --Heart: Normal size. No pericardial effusion. Mediastinum/Nodes: No mediastinal, hilar or axillary lymphadenopathy. The visualized thyroid and thoracic esophageal course are unremarkable. Lungs/Pleura: There is multifocal bilateral consolidation, worst in the right lower lobe. No pleural effusion or pneumothorax. Upper Abdomen: Contrast bolus timing is not optimized for evaluation of the abdominal organs. Within this limitation, the visualized organs of the upper abdomen are normal. Musculoskeletal: No chest wall abnormality. No acute or significant osseous findings. Review of the MIP images confirms the above findings. IMPRESSION: 1. No pulmonary embolus or acute aortic syndrome. 2. Multifocal bilateral airspace consolidation, worst in the right lower lobe, consistent with multifocal pneumonia. Electronically Signed   By: Deatra Robinson M.D.   On: 06/19/2017 01:37     EKG: Independently reviewed. Sinus tachycardia (rate 114).   Assessment/Plan   1. Multifocal pneumonia  - Presents with fevers, chills, productive cough and progressive SOB  - He was diagnosed with acute otitis media and viral URI 3 days ago and started on amoxicillin, but has continued to worsen  - He is afebrile here and without leukocytosis, but has increased work of breathing, tachycardia, and multifocal infiltrates on CTA chest consistent with pneumonia  - Treated with Rocephin and azithromycin in ED  - Check sputum culture and strep pneumo antigen, continue current abx    2. Mild renal insufficiency  - SCr is 1.40 on admission, up from 1.2 a year ago  - Prerenal azotemia in setting of acute infection vs progression of CKD  - Hold losartan-HCTZ, continue IVF hydration, repeat chem panel in am    3. Hypertension  - BP at goal  - Hold losartan-HCTZ in light of increased creatinine    4. Anxiety  - Stable, continue prn Xanax     DVT prophylaxis: Lovenox Code Status: Full  Family Communication: Discussed with patient Consults called: none Admission status: Observation     Briscoe Deutscher, MD [facility] Pager [phone]  If 7PM-7AM, please contact night-coverage [email or web address] Password [facility]  06/19/2017, 2:32 AM

## 2017-06-19 NOTE — Progress Notes (Signed)
PROGRESS NOTE        PATIENT DETAILS Name: Jeremiah Lopez Age: 57 y.o. Sex: male Date of Birth: [birth year] Admit Date: 06/18/2017 Admitting Physician Briscoe Deutscher, MD PCP:Ee, Thomasenia Sales, FNP  Brief Narrative: Patient is a 57 y.o. male with prior history of hypertension presented to the hospital for 4-5-day history of cough, fever and mild shortness of breath.  He was recently thought to have URI with otitis media-and started on amoxicillin.  In the emergency room, a CT scan of the chest showed multifocal pneumonia, patient was started on IV Rocephin/Zithromax and subsequently admitted to the hospitalist service.  Subjective: Continues to cough-claims to have itchy eyes, nasal discharge as well.  Assessment/Plan: Multifocal pneumonia/community acquired pneumonia: Somewhat improved compared to yesterday-continue with Rocephin/Zithromax.  Afebrile, no leukocytosis-stress sputum cultures pending.  Hypokalemia: Replete and recheck  Acute kidney injury: Mild AKI likely hemodynamically mediated-with contributions from losartan/HCTZ use-improving with gentle hydration.  Hypertension: Pressure reasonably well controlled-HCTZ/losartan currently on hold.  Resume antihypertensives when able  Anxiety: Continue with as needed Xanax  Allergic rhinitis/conjunctivitis: Complains of nasal congestion-burning/itching eyes-start antihistamine, Flonase and Afrin nasal spray.  Follow  Morbid obesity  DVT Prophylaxis: Prophylactic Lovenox   Code Status: Full code   Family Communication: Spouse at bedside  Disposition Plan: Remain inpatient-home tomorrow or day after  Antimicrobial agents: Anti-infectives (From admission, onward)   Start     Dose/Rate Route Frequency Ordered Stop   06/20/17 0200  cefTRIAXone (ROCEPHIN) 1 g in sodium chloride 0.9 % 100 mL IVPB     1 g 200 mL/hr over 30 Minutes Intravenous Every 24 hours 06/19/17 0224 06/26/17 0159   06/20/17 0200   azithromycin (ZITHROMAX) 500 mg in sodium chloride 0.9 % 250 mL IVPB     500 mg 250 mL/hr over 60 Minutes Intravenous Every 24 hours 06/19/17 0224 06/26/17 0159   06/19/17 0200  cefTRIAXone (ROCEPHIN) 1 g in sodium chloride 0.9 % 100 mL IVPB     1 g 200 mL/hr over 30 Minutes Intravenous  Once 06/19/17 0150 06/19/17 0346   06/19/17 0200  azithromycin (ZITHROMAX) 500 mg in sodium chloride 0.9 % 250 mL IVPB     500 mg 250 mL/hr over 60 Minutes Intravenous  Once 06/19/17 0150 06/19/17 0357      Procedures: None  CONSULTS:  None  Time spent: 25 minutes-Greater than 50% of this time was spent in counseling, explanation of diagnosis, planning of further management, and coordination of care.  MEDICATIONS: Scheduled Meds: . aspirin EC  81 mg Oral [id number]  . enoxaparin (LOVENOX) injection  40 mg Subcutaneous Daily  . latanoprost  1 drop Both Eyes QHS  . sodium chloride flush  3 mL Intravenous Q12H   Continuous Infusions: . [START ON 06/20/2017] azithromycin    . [START ON 06/20/2017] cefTRIAXone (ROCEPHIN)  IV     PRN Meds:.acetaminophen **OR** acetaminophen, albuterol, alprazolam, benzonatate, bisacodyl, cyclobenzaprine, HYDROcodone-acetaminophen, ondansetron **OR** ondansetron (ZOFRAN) IV, senna-docusate   PHYSICAL EXAM: Vital signs: Vitals:   06/19/17 0230 06/19/17 0330 06/19/17 0533 06/19/17 1132  BP: 125/67 (!) 105/49 (!) 145/80 128/74  Pulse: (!) 110 (!) 106 (!) 109 (!) 101  Resp: (!) Temp:   99.1 F (37.3 C) 98.5 F (36.9 C)  TempSrc:   Oral Oral  SpO2: 96% 94% 97% 96%  Weight:   Marland Kitchen)  144.7 kg (319 lb 0.1 oz)   Height:       Filed Weights   06/18/17 2204 06/19/17 0533  Weight: (!) 147 kg (324 lb) (!) 144.7 kg (319 lb 0.1 oz)   Body mass index is 43.26 kg/m.   General appearance :Awake, alert, not in any distress.  HEENT: Atraumatic and Normocephalic Neck: supple, no JVD. No cervical lymphadenopathy. No thyromegaly Resp:Good air entry bilaterally, no  added sounds  CVS: S1 S2 regular, no murmurs.  GI: Bowel sounds present, Non tender and not distended with no gaurding, rigidity or rebound.No organomegaly Extremities: B/L Lower Ext shows no edema, both legs are warm to touch Neurology:  speech clear,Non focal, sensation is grossly intact. Psychiatric: Normal judgment and insight. Alert and oriented x 3. Normal mood. Musculoskeletal:No digital cyanosis Skin:No Rash, warm and dry Wounds:N/A  I have personally reviewed following labs and imaging studies  LABORATORY DATA: CBC: Recent Labs  Lab 06/18/17 2216 06/19/17 0303  WBC 9.6 9.5  NEUTROABS  --  6.0  HGB 13.4 12.1*  HCT 39.6 35.6*  MCV 88.6 89.2  PLT 251 234    Basic Metabolic Panel: Recent Labs  Lab 06/18/17 2216 06/19/17 0303  NA 137 135  K 3.7 3.2*  CL 104 104  CO2 24 20*  GLUCOSE 170* 141*  BUN 9 9  CREATININE 1.40* 1.28*  CALCIUM 8.7* 8.0*    GFR: Estimated Creatinine Clearance: 94 mL/min (A) (by C-G formula based on SCr of 1.28 mg/dL (H)).  Liver Function Tests: No results for input(s): AST, ALT, ALKPHOS, BILITOT, PROT, ALBUMIN in the last 168 hours. No results for input(s): LIPASE, AMYLASE in the last 168 hours. No results for input(s): AMMONIA in the last 168 hours.  Coagulation Profile: No results for input(s): INR, PROTIME in the last 168 hours.  Cardiac Enzymes: No results for input(s): CKTOTAL, CKMB, CKMBINDEX, TROPONINI in the last 168 hours.  BNP (last 3 results) No results for input(s): PROBNP in the last 8760 hours.  HbA1C: No results for input(s): HGBA1C in the last 72 hours.  CBG: No results for input(s): GLUCAP in the last 168 hours.  Lipid Profile: No results for input(s): CHOL, HDL, LDLCALC, TRIG, CHOLHDL, LDLDIRECT in the last 72 hours.  Thyroid Function Tests: No results for input(s): TSH, T4TOTAL, FREET4, T3FREE, THYROIDAB in the last 72 hours.  Anemia Panel: No results for input(s): VITAMINB12, FOLATE, FERRITIN, TIBC,  IRON, RETICCTPCT in the last 72 hours.  Urine analysis:    Component Value Date/Time   COLORURINE YELLOW 06/18/2017 2208   APPEARANCEUR CLEAR 06/18/2017 2208   LABSPEC 1.028 06/18/2017 2208   PHURINE 7.0 06/18/2017 2208   GLUCOSEU NEGATIVE 06/18/2017 2208   HGBUR NEGATIVE 06/18/2017 2208   BILIRUBINUR NEGATIVE 06/18/2017 2208   KETONESUR NEGATIVE 06/18/2017 2208   PROTEINUR 100 (A) 06/18/2017 2208   NITRITE NEGATIVE 06/18/2017 2208   LEUKOCYTESUR NEGATIVE 06/18/2017 2208    Sepsis Labs: Lactic Acid, Venous    Component Value Date/Time   LATICACIDVEN 1.9 06/19/2017 1610    MICROBIOLOGY: Recent Results (from the past 240 hour(s))  Culture, sputum-assessment     Status: None   Collection Time: 06/19/17  3:40 AM  Result Value Ref Range Status   Specimen Description EXPECTORATED SPUTUM  Final   Special Requests Normal  Final   Sputum evaluation   Final    THIS SPECIMEN IS ACCEPTABLE FOR SPUTUM CULTURE Performed at [facility], [address] [place], [place] [address]  Report Status 06/19/2017 FINAL  Final  Culture, respiratory (NON-Expectorated)     Status: None (Preliminary result)   Collection Time: 06/19/17  3:40 AM  Result Value Ref Range Status   Specimen Description EXPECTORATED SPUTUM  Final   Special Requests Normal Reflexed from [phone]  Final   Gram Stain   Final    DEGENERATED CELLULAR MATERIAL NO SQUAMOUS EPITHELIAL CELLS SEEN MODERATE GRAM NEGATIVE RODS FEW GRAM POSITIVE COCCI IN PAIRS Performed at [facility], [address] [place], [place] [address]    Culture PENDING  Incomplete   Report Status PENDING  Incomplete    RADIOLOGY STUDIES/RESULTS: Dg Chest 2 View  Result Date: 06/18/2017 CLINICAL DATA:  Dyspnea and productive cough EXAM: CHEST - 2 VIEW COMPARISON:  06/16/2017 FINDINGS: Heart and mediastinal contours are normal. Mild peribronchial thickening with interstitial prominence compatible with chronic bronchitic change. Mild  central vascular congestion is also noted without effusion or pneumothorax. No acute osseous abnormality. IMPRESSION: Peribronchial thickening and increased interstitial lung markings compatible with chronic bronchitic change. Mild vascular congestion noted Electronically Signed   By: Tollie Eth M.D.   On: 06/18/2017 23:24   Dg Chest 2 View  Result Date: 06/16/2017 CLINICAL DATA:  Cough, fever EXAM: CHEST - 2 VIEW COMPARISON:  04/25/2015 FINDINGS: Peribronchial thickening and interstitial prominence, likely bronchitis. Heart is normal size. No confluent opacities or effusions. No acute bony abnormality. IMPRESSION: Bronchitic changes. Electronically Signed   By: Charlett Nose M.D.   On: 06/16/2017 18:29   Ct Angio Chest Pe W And/or Wo Contrast  Result Date: 06/19/2017 CLINICAL DATA:  Shortness of breath and cough EXAM: CT ANGIOGRAPHY CHEST WITH CONTRAST TECHNIQUE: Multidetector CT imaging of the chest was performed using the standard protocol during bolus administration of intravenous contrast. Multiplanar CT image reconstructions and MIPs were obtained to evaluate the vascular anatomy. CONTRAST:  ISOVUE-370 IOPAMIDOL (ISOVUE-370) INJECTION 76% COMPARISON:  Chest radiograph 06/18/2017 FINDINGS: Cardiovascular: --Pulmonary arteries: Contrast injection is sufficient to demonstrate satisfactory opacification of the pulmonary arteries to the segmental level. There is no pulmonary embolus. The main pulmonary artery is within normal limits for size. --Aorta: Limited opacification of the aorta due to bolus timing optimization for the pulmonary arteries. Conventional 3 vessel aortic branching pattern. The aortic course and caliber are normal. There is no aortic atherosclerosis. --Heart: Normal size. No pericardial effusion. Mediastinum/Nodes: No mediastinal, hilar or axillary lymphadenopathy. The visualized thyroid and thoracic esophageal course are unremarkable. Lungs/Pleura: There is multifocal bilateral  consolidation, worst in the right lower lobe. No pleural effusion or pneumothorax. Upper Abdomen: Contrast bolus timing is not optimized for evaluation of the abdominal organs. Within this limitation, the visualized organs of the upper abdomen are normal. Musculoskeletal: No chest wall abnormality. No acute or significant osseous findings. Review of the MIP images confirms the above findings. IMPRESSION: 1. No pulmonary embolus or acute aortic syndrome. 2. Multifocal bilateral airspace consolidation, worst in the right lower lobe, consistent with multifocal pneumonia. Electronically Signed   By: Deatra Robinson M.D.   On: 06/19/2017 01:37     LOS: 0 days   Jeoffrey Massed, MD  [facility]  If 7PM-7AM, please contact night-coverage  Please page via [email or web address] [email or web address]-click on MD name and type text message  06/19/2017, 1:59 PM

## 2017-06-19 NOTE — ED Provider Notes (Signed)
Assumed care from PA Geiple at shift change.  See prior notes for full H&P.  Briefly, 57 y.o. M here with continued cough and fever.  Diagnosed with viral URI last week at urgent care but started on abx as well.  States he has not had any improvement, cough remains persistent.  Patient with low-grade fever and tachycardia here, worse after albuterol.  Normal WBC count.  CXR with increased interstitial markings.  D-dimer elevated.  Plan:  CTA pending to assess for PE.  Disposition pending results.  Results for orders placed or performed during the hospital encounter of 06/18/17  Urinalysis, Routine w reflex microscopic  Result Value Ref Range   Color, Urine YELLOW YELLOW   APPearance CLEAR CLEAR   Specific Gravity, Urine 1.028 1.005 - 1.030   pH 7.0 5.0 - 8.0   Glucose, UA NEGATIVE NEGATIVE mg/dL   Hgb urine dipstick NEGATIVE NEGATIVE   Bilirubin Urine NEGATIVE NEGATIVE   Ketones, ur NEGATIVE NEGATIVE mg/dL   Protein, ur 161 (A) NEGATIVE mg/dL   Nitrite NEGATIVE NEGATIVE   Leukocytes, UA NEGATIVE NEGATIVE   RBC / HPF 0-5 0 - 5 RBC/hpf   WBC, UA 0-5 0 - 5 WBC/hpf   Bacteria, UA NONE SEEN NONE SEEN   Squamous Epithelial / LPF 0-5 0 - 5  Basic metabolic panel  Result Value Ref Range   Sodium 137 135 - 145 mmol/L   Potassium 3.7 3.5 - 5.1 mmol/L   Chloride 104 101 - 111 mmol/L   CO2 24 22 - 32 mmol/L   Glucose, Bld 170 (H) 65 - 99 mg/dL   BUN 9 6 - 20 mg/dL   Creatinine, Ser 0.96 (H) 0.61 - 1.24 mg/dL   Calcium 8.7 (L) 8.9 - 10.3 mg/dL   GFR calc non Af Amer 54 (L) >60 mL/min   GFR calc Af Amer >60 >60 mL/min   Anion gap 9 5 - 15  CBC  Result Value Ref Range   WBC 9.6 4.0 - 10.5 K/uL   RBC 4.47 4.22 - 5.81 MIL/uL   Hemoglobin 13.4 13.0 - 17.0 g/dL   HCT 04.5 40.9 - 81.1 %   MCV 88.6 78.0 - 100.0 fL   MCH 30.0 26.0 - 34.0 pg   MCHC 33.8 30.0 - 36.0 g/dL   RDW 91.4 78.2 - 95.6 %   Platelets 251 150 - 400 K/uL  Brain natriuretic peptide  Result Value Ref Range   B Natriuretic  Peptide 81.7 0.0 - 100.0 pg/mL  D-dimer, quantitative (not at [facility])  Result Value Ref Range   D-Dimer, Quant 0.92 (H) 0.00 - 0.50 ug/mL-FEU  I-Stat CG4 Lactic Acid, ED  Result Value Ref Range   Lactic Acid, Venous 2.11 (HH) 0.5 - 1.9 mmol/L   Comment NOTIFIED PHYSICIAN   I-stat troponin, ED  Result Value Ref Range   Troponin i, poc 0.00 0.00 - 0.08 ng/mL   Comment 3          I-Stat CG4 Lactic Acid, ED  Result Value Ref Range   Lactic Acid, Venous 2.40 (HH) 0.5 - 1.9 mmol/L   Comment NOTIFIED PHYSICIAN    Dg Chest 2 View  Result Date: 06/18/2017 CLINICAL DATA:  Dyspnea and productive cough EXAM: CHEST - 2 VIEW COMPARISON:  06/16/2017 FINDINGS: Heart and mediastinal contours are normal. Mild peribronchial thickening with interstitial prominence compatible with chronic bronchitic change. Mild central vascular congestion is also noted without effusion or pneumothorax. No acute osseous abnormality. IMPRESSION: Peribronchial thickening and increased  interstitial lung markings compatible with chronic bronchitic change. Mild vascular congestion noted Electronically Signed   By: Tollie Eth M.D.   On: 06/18/2017 23:24   Dg Chest 2 View  Result Date: 06/16/2017 CLINICAL DATA:  Cough, fever EXAM: CHEST - 2 VIEW COMPARISON:  04/25/2015 FINDINGS: Peribronchial thickening and interstitial prominence, likely bronchitis. Heart is normal size. No confluent opacities or effusions. No acute bony abnormality. IMPRESSION: Bronchitic changes. Electronically Signed   By: Charlett Nose M.D.   On: 06/16/2017 18:29   Ct Angio Chest Pe W And/or Wo Contrast  Result Date: 06/19/2017 CLINICAL DATA:  Shortness of breath and cough EXAM: CT ANGIOGRAPHY CHEST WITH CONTRAST TECHNIQUE: Multidetector CT imaging of the chest was performed using the standard protocol during bolus administration of intravenous contrast. Multiplanar CT image reconstructions and MIPs were obtained to evaluate the vascular anatomy. CONTRAST:   ISOVUE-370 IOPAMIDOL (ISOVUE-370) INJECTION 76% COMPARISON:  Chest radiograph 06/18/2017 FINDINGS: Cardiovascular: --Pulmonary arteries: Contrast injection is sufficient to demonstrate satisfactory opacification of the pulmonary arteries to the segmental level. There is no pulmonary embolus. The main pulmonary artery is within normal limits for size. --Aorta: Limited opacification of the aorta due to bolus timing optimization for the pulmonary arteries. Conventional 3 vessel aortic branching pattern. The aortic course and caliber are normal. There is no aortic atherosclerosis. --Heart: Normal size. No pericardial effusion. Mediastinum/Nodes: No mediastinal, hilar or axillary lymphadenopathy. The visualized thyroid and thoracic esophageal course are unremarkable. Lungs/Pleura: There is multifocal bilateral consolidation, worst in the right lower lobe. No pleural effusion or pneumothorax. Upper Abdomen: Contrast bolus timing is not optimized for evaluation of the abdominal organs. Within this limitation, the visualized organs of the upper abdomen are normal. Musculoskeletal: No chest wall abnormality. No acute or significant osseous findings. Review of the MIP images confirms the above findings. IMPRESSION: 1. No pulmonary embolus or acute aortic syndrome. 2. Multifocal bilateral airspace consolidation, worst in the right lower lobe, consistent with multifocal pneumonia. Electronically Signed   By: Deatra Robinson M.D.   On: 06/19/2017 01:37    1:50 AM CTA with multi-focal pnuemonia, no PE.  Patient remains tachycardic here, lactate worsening.  Has already failed course of OP abx.  Will start IV rocephin and azithromycin and admit for ongoing care.  Discussed with Dr. Antionette Char-- he will admit for ongoing care.   Garlon Hatchet, PA-C 06/19/17 1610    Nicanor Alcon, April, MD 06/19/17 [id number]

## 2017-06-19 NOTE — Plan of Care (Signed)
Encouraged pt to cover cough as observed to not.

## 2017-06-19 NOTE — ED Notes (Signed)
Patient transported to CT 

## 2017-06-20 DIAGNOSIS — J189 Pneumonia, unspecified organism: Secondary | ICD-10-CM

## 2017-06-20 LAB — BASIC METABOLIC PANEL
Anion gap: 10 (ref 5–15)
BUN: 7 mg/dL (ref 6–20)
CHLORIDE: 105 mmol/L (ref 101–111)
CO2: 22 mmol/L (ref 22–32)
Calcium: 8.1 mg/dL — ABNORMAL LOW (ref 8.9–10.3)
Creatinine, Ser: 1.33 mg/dL — ABNORMAL HIGH (ref 0.61–1.24)
GFR calc non Af Amer: 58 mL/min — ABNORMAL LOW (ref >60)
Glucose, Bld: 127 mg/dL — ABNORMAL HIGH (ref 65–99)
POTASSIUM: 3.6 mmol/L (ref 3.5–5.1)
Sodium: 137 mmol/L (ref 135–145)

## 2017-06-20 LAB — GLUCOSE, CAPILLARY: Glucose-Capillary: 114 mg/dL — ABNORMAL HIGH (ref 65–99)

## 2017-06-20 MED ORDER — MONTELUKAST SODIUM 10 MG PO TABS: 10.0000 mg | ORAL_TABLET | Freq: Every day | ORAL | 11 refills | Status: AC

## 2017-06-20 MED ORDER — CEPHALEXIN 500 MG PO CAPS: 500.0000 mg | ORAL_CAPSULE | Freq: Three times a day (TID) | ORAL | 0 refills | Status: AC

## 2017-06-20 MED ORDER — AZITHROMYCIN 500 MG PO TABS: 500.0000 mg | ORAL_TABLET | Freq: Every day | ORAL | 0 refills | Status: DC

## 2017-06-20 MED ORDER — AMLODIPINE BESYLATE 10 MG PO TABS: 10.0000 mg | ORAL_TABLET | Freq: Every day | ORAL | 11 refills | Status: AC

## 2017-06-20 NOTE — Progress Notes (Signed)
Per dr Thedore Mins, tessalon is home med and pt to call PCPfor refill, advised pt and wifeand verbalized understanding, pt dc home in [place] with assist of NA

## 2017-06-20 NOTE — Discharge Instructions (Signed)
Follow with Primary MD Ranae Pila, FNP in 7 days   Get CBC, CMP, 2 view Chest X ray checked  by Primary MD in 5-7 days   Activity: As tolerated with Full fall precautions use walker/cane & assistance as needed  Disposition Home    Diet:  Heart Healthy   For Heart failure patients - Check your Weight same time everyday, if you gain over 2 pounds, or you develop in leg swelling, experience more shortness of breath or chest pain, call your Primary MD immediately. Follow Cardiac Low Salt Diet and 1.5 lit/day fluid restriction.  Special Instructions: If you have smoked or chewed Tobacco  in the last 2 yrs please stop smoking, stop any regular Alcohol  and or any Recreational drug use.  On your next visit with your primary care physician please Get Medicines reviewed and adjusted.  Please request your Prim.MD to go over all Hospital Tests and Procedure/Radiological results at the follow up, please get all Hospital records sent to your Prim MD by signing hospital release before you go home.  If you experience worsening of your admission symptoms, develop shortness of breath, life threatening emergency, suicidal or homicidal thoughts you must seek medical attention immediately by calling 911 or calling your MD immediately  if symptoms less severe.  You Must read complete instructions/literature along with all the possible adverse reactions/side effects for all the Medicines you take and that have been prescribed to you. Take any new Medicines after you have completely understood and accpet all the possible adverse reactions/side effects.

## 2017-06-20 NOTE — Progress Notes (Signed)
Pt and wife given avs instructions and rx, upon review pt has tessalon pearles ordered but no rx and pt says he does not have at home, paged md per pt request for rx, pt denies cp or sob and stable upon dc

## 2017-06-20 NOTE — Discharge Summary (Signed)
Jeremiah Lopez:[id number] DOB: [birth year] DOA: 06/18/2017  PCP: Ranae Pila, FNP  Admit date: 06/18/2017  Discharge date: 06/20/2017  Admitted From: Home   Disposition:  Home   Recommendations for Outpatient Follow-up:   Follow up with PCP in 1-2 weeks  PCP Please obtain BMP/CBC, 2 view CXR in 1week,  (see Discharge instructions)   PCP Please follow up on the following pending results:    Home Health: None   Equipment/Devices: None  Consultations: None Discharge Condition: Stable   CODE STATUS: Full   Diet Recommendation: Heart Healthy    Chief Complaint  Patient presents with  . Shortness of Breath     Brief history of present illness from the day of admission and additional interim summary    Patient is a 57 y.o. male with prior history of hypertension presented to the hospital for 4-5-day history of cough, fever and mild shortness of breath.  He was recently thought to have URI with otitis media-and started on amoxicillin.  In the emergency room, a CT scan of the chest showed multifocal pneumonia, patient was started on IV Rocephin/Zithromax and subsequently admitted to the hospitalist service.                                                                  Hospital Course   Multifocal pneumonia/community acquired pneumonia:  Failed outpatient Augmentin, much improved with combination of Rocephin and azithromycin, completely symptom-free, ambulating in the hallway on room air without any distress, will be given 5 more days of Keflex and azithromycin combination with outpatient PCP follow-up in 1 week.  Request PCP to repeat CBC, BMP and a two-view chest x-ray next visit.  Hypokalemia: Placed in stable.  Acute kidney injury: ARF.  No baseline renal function on record, hold ARB and HCTZ follow with  PCP.  Renal function is plateaued.  Good urine output.  Hypertension: Due to renal insufficiency switched from ARB/HCTZ to Norvasc.    Allergic rhinitis/conjunctivitis: Complains of nasal congestion-burning/itching eyes-start antihistamine, placed on Singulair and Flonase.  Morbid obesity - follow with PCP for weight loss.   Discharge diagnosis     Principal Problem:   Multifocal pneumonia Active Problems:   Hypertension   Anxiety   Mild renal insufficiency   CAP (community acquired pneumonia)    Discharge instructions    Discharge Instructions    Diet - low sodium heart healthy   Complete by:  As directed    Discharge instructions   Complete by:  As directed    Follow with Primary MD Ranae Pila, FNP in 7 days   Get CBC, CMP, 2 view Chest X ray checked  by Primary MD in 5-7 days   Activity: As tolerated with Full fall precautions use walker/cane & assistance as needed  Disposition Home  Diet:  Heart Healthy   For Heart failure patients - Check your Weight same time everyday, if you gain over 2 pounds, or you develop in leg swelling, experience more shortness of breath or chest pain, call your Primary MD immediately. Follow Cardiac Low Salt Diet and 1.5 lit/day fluid restriction.  Special Instructions: If you have smoked or chewed Tobacco  in the last 2 yrs please stop smoking, stop any regular Alcohol  and or any Recreational drug use.  On your next visit with your primary care physician please Get Medicines reviewed and adjusted.  Please request your Prim.MD to go over all Hospital Tests and Procedure/Radiological results at the follow up, please get all Hospital records sent to your Prim MD by signing hospital release before you go home.  If you experience worsening of your admission symptoms, develop shortness of breath, life threatening emergency, suicidal or homicidal thoughts you must seek medical attention immediately by calling 911 or calling your MD  immediately  if symptoms less severe.  You Must read complete instructions/literature along with all the possible adverse reactions/side effects for all the Medicines you take and that have been prescribed to you. Take any new Medicines after you have completely understood and accpet all the possible adverse reactions/side effects   Increase activity slowly   Complete by:  As directed       Discharge Medications   Allergies as of 06/20/2017      Reactions   Bee Venom       Medication List    STOP taking these medications   amoxicillin 500 MG capsule Commonly known as:  AMOXIL   ibuprofen 200 MG tablet Commonly known as:  ADVIL,MOTRIN   losartan-hydrochlorothiazide 100-25 MG tablet Commonly known as:  HYZAAR     TAKE these medications   albuterol 108 (90 Base) MCG/ACT inhaler Commonly known as:  PROVENTIL HFA;VENTOLIN HFA Inhale 1-2 puffs into the lungs every 6 (six) hours as needed for wheezing or shortness of breath.   alprazolam 2 MG tablet Commonly known as:  XANAX Take 2 mg by mouth at bedtime as needed for sleep.   amLODipine 10 MG tablet Commonly known as:  NORVASC Take 1 tablet (10 mg total) by mouth daily.   aspirin EC 81 MG tablet Take 81 mg by mouth every morning.   azithromycin 500 MG tablet Commonly known as:  ZITHROMAX Take 1 tablet (500 mg total) by mouth daily.   benzonatate 200 MG capsule Commonly known as:  TESSALON Take 1 capsule (200 mg total) by mouth every 8 (eight) hours.   cephALEXin 500 MG capsule Commonly known as:  KEFLEX Take 1 capsule (500 mg total) by mouth 3 (three) times daily for 7 days.   cyclobenzaprine 10 MG tablet Commonly known as:  FLEXERIL Take 10 mg by mouth 3 (three) times daily as needed for muscle spasms.   fluticasone 50 MCG/ACT nasal spray Commonly known as:  FLONASE Place 1-2 sprays into both nostrils daily for 7 days.   LUMIGAN 0.01 % Soln Generic drug:  bimatoprost Place 1 drop into both eyes at  bedtime.   montelukast 10 MG tablet Commonly known as:  SINGULAIR Take 1 tablet (10 mg total) by mouth daily.   pantoprazole 40 MG tablet Commonly known as:  PROTONIX Take 40 mg by mouth daily as needed for heartburn.       Follow-up Information    Ranae Pila, FNP. Schedule an appointment as soon as possible for a visit in 1  week(s).   Specialty:  Internal Medicine Contact information: [address] [place] [address] [phone]           Major procedures and Radiology Reports - PLEASE review detailed and final reports thoroughly  -        Dg Chest 2 View  Result Date: 06/18/2017 CLINICAL DATA:  Dyspnea and productive cough EXAM: CHEST - 2 VIEW COMPARISON:  06/16/2017 FINDINGS: Heart and mediastinal contours are normal. Mild peribronchial thickening with interstitial prominence compatible with chronic bronchitic change. Mild central vascular congestion is also noted without effusion or pneumothorax. No acute osseous abnormality. IMPRESSION: Peribronchial thickening and increased interstitial lung markings compatible with chronic bronchitic change. Mild vascular congestion noted Electronically Signed   By: Tollie Eth M.D.   On: 06/18/2017 23:24   Dg Chest 2 View  Result Date: 06/16/2017 CLINICAL DATA:  Cough, fever EXAM: CHEST - 2 VIEW COMPARISON:  04/25/2015 FINDINGS: Peribronchial thickening and interstitial prominence, likely bronchitis. Heart is normal size. No confluent opacities or effusions. No acute bony abnormality. IMPRESSION: Bronchitic changes. Electronically Signed   By: Charlett Nose M.D.   On: 06/16/2017 18:29   Ct Angio Chest Pe W And/or Wo Contrast  Result Date: 06/19/2017 CLINICAL DATA:  Shortness of breath and cough EXAM: CT ANGIOGRAPHY CHEST WITH CONTRAST TECHNIQUE: Multidetector CT imaging of the chest was performed using the standard protocol during bolus administration of intravenous contrast. Multiplanar CT image reconstructions  and MIPs were obtained to evaluate the vascular anatomy. CONTRAST:  ISOVUE-370 IOPAMIDOL (ISOVUE-370) INJECTION 76% COMPARISON:  Chest radiograph 06/18/2017 FINDINGS: Cardiovascular: --Pulmonary arteries: Contrast injection is sufficient to demonstrate satisfactory opacification of the pulmonary arteries to the segmental level. There is no pulmonary embolus. The main pulmonary artery is within normal limits for size. --Aorta: Limited opacification of the aorta due to bolus timing optimization for the pulmonary arteries. Conventional 3 vessel aortic branching pattern. The aortic course and caliber are normal. There is no aortic atherosclerosis. --Heart: Normal size. No pericardial effusion. Mediastinum/Nodes: No mediastinal, hilar or axillary lymphadenopathy. The visualized thyroid and thoracic esophageal course are unremarkable. Lungs/Pleura: There is multifocal bilateral consolidation, worst in the right lower lobe. No pleural effusion or pneumothorax. Upper Abdomen: Contrast bolus timing is not optimized for evaluation of the abdominal organs. Within this limitation, the visualized organs of the upper abdomen are normal. Musculoskeletal: No chest wall abnormality. No acute or significant osseous findings. Review of the MIP images confirms the above findings. IMPRESSION: 1. No pulmonary embolus or acute aortic syndrome. 2. Multifocal bilateral airspace consolidation, worst in the right lower lobe, consistent with multifocal pneumonia. Electronically Signed   By: Deatra Robinson M.D.   On: 06/19/2017 01:37    Micro Results    Recent Results (from the past 240 hour(s))  Culture, blood (routine x 2) Call MD if unable to obtain prior to antibiotics being given     Status: None (Preliminary result)   Collection Time: 06/19/17  2:35 AM  Result Value Ref Range Status   Specimen Description BLOOD RIGHT HAND  Final   Special Requests   Final    BOTTLES DRAWN AEROBIC AND ANAEROBIC Blood Culture adequate  volume   Culture   Final    NO GROWTH 1 DAY Performed at [facility], [address] [place], [place] [address]    Report Status PENDING  Incomplete  Culture, blood (routine x 2) Call MD if unable to obtain prior to antibiotics being given  Status: None (Preliminary result)   Collection Time: 06/19/17  2:50 AM  Result Value Ref Range Status   Specimen Description BLOOD LEFT ANTECUBITAL  Final   Special Requests   Final    BOTTLES DRAWN AEROBIC AND ANAEROBIC Blood Culture adequate volume   Culture   Final    NO GROWTH 1 DAY Performed at [facility], [address] [place], [place] [address]    Report Status PENDING  Incomplete  Culture, sputum-assessment     Status: None   Collection Time: 06/19/17  3:40 AM  Result Value Ref Range Status   Specimen Description EXPECTORATED SPUTUM  Final   Special Requests Normal  Final   Sputum evaluation   Final    THIS SPECIMEN IS ACCEPTABLE FOR SPUTUM CULTURE Performed at [facility], [address], [place], [place] [address]    Report Status 06/19/2017 FINAL  Final  Culture, respiratory (NON-Expectorated)     Status: None (Preliminary result)   Collection Time: 06/19/17  3:40 AM  Result Value Ref Range Status   Specimen Description EXPECTORATED SPUTUM  Final   Special Requests Normal Reflexed from [id number]  Final   Gram Stain   Final    DEGENERATED CELLULAR MATERIAL NO SQUAMOUS EPITHELIAL CELLS SEEN MODERATE GRAM NEGATIVE RODS FEW GRAM POSITIVE COCCI IN PAIRS Performed at [facility], [address] [place], [place] [address]    Culture PENDING  Incomplete   Report Status PENDING  Incomplete    Today   Subjective    Jeremiah Lopez today has no headache,no chest abdominal pain,no new weakness tingling or numbness, feels much better wants to go home today.    Objective   Blood pressure (!) 144/79, pulse 92, temperature 98.8 F (37.1 C), temperature source Oral, resp. rate 20, height 6' (1.829 m),  weight (!) 145.3 kg (320 lb 4.8 oz), SpO2 97 %.   Intake/Output Summary (Last 24 hours) at 06/20/2017 1006 Last data filed at 06/20/2017 0900 Gross per 24 hour  Intake 1070 ml  Output 925 ml  Net 145 ml    Exam Awake Alert, Oriented x 3, No new F.N deficits, Normal affect .AT,PERRAL Supple Neck,No JVD, No cervical lymphadenopathy appriciated.  Symmetrical Chest wall movement, Good air movement bilaterally, CTAB RRR,No Gallops,Rubs or new Murmurs, No Parasternal Heave +ve B.Sounds, Abd Soft, Non tender, No organomegaly appriciated, No rebound -guarding or rigidity. No Cyanosis, Clubbing or edema, No new Rash or bruise   Data Review   CBC w Diff:  Lab Results  Component Value Date   WBC 9.5 06/19/2017   HGB 12.1 (L) 06/19/2017   HCT 35.6 (L) 06/19/2017   PLT 234 06/19/2017   LYMPHOPCT 20 06/19/2017   MONOPCT 14 06/19/2017   EOSPCT 2 06/19/2017   BASOPCT 0 06/19/2017    CMP:  Lab Results  Component Value Date   NA 137 06/20/2017   K 3.6 06/20/2017   CL 105 06/20/2017   CO2 22 06/20/2017   BUN 7 06/20/2017   CREATININE 1.33 (H) 06/20/2017  .   Total Time in preparing paper work, data evaluation and todays exam - 35 minutes  Susa Raring M.D on 06/20/2017 at 10:06 AM  [facility]   Office  [phone]

## 2017-06-21 LAB — CULTURE, RESPIRATORY W GRAM STAIN: Special Requests: NORMAL

## 2017-06-21 LAB — CULTURE, RESPIRATORY

## 2017-06-24 LAB — CULTURE, BLOOD (ROUTINE X 2)
Culture: NO GROWTH
Culture: NO GROWTH
Special Requests: ADEQUATE
Special Requests: ADEQUATE

## 2017-10-22 ENCOUNTER — Emergency Department (HOSPITAL_COMMUNITY): Payer: Medicare HMO

## 2017-10-22 ENCOUNTER — Other Ambulatory Visit: Payer: Self-pay

## 2017-10-22 ENCOUNTER — Emergency Department (HOSPITAL_COMMUNITY)
Admission: EM | Admit: 2017-10-22 | Discharge: 2017-10-22 | Disposition: A | Discharge status: Home / Self Care | Payer: Medicare HMO | Attending: Emergency Medicine | Admitting: Emergency Medicine

## 2017-10-22 DIAGNOSIS — I10 Essential (primary) hypertension: Secondary | ICD-10-CM | POA: Diagnosis present

## 2017-10-22 DIAGNOSIS — Z87891 Personal history of nicotine dependence: Secondary | ICD-10-CM | POA: Insufficient documentation

## 2017-10-22 DIAGNOSIS — Z79899 Other long term (current) drug therapy: Secondary | ICD-10-CM | POA: Diagnosis not present

## 2017-10-22 DIAGNOSIS — Z7982 Long term (current) use of aspirin: Secondary | ICD-10-CM | POA: Diagnosis not present

## 2017-10-22 LAB — CBC WITH DIFFERENTIAL/PLATELET
Abs Immature Granulocytes: 0 10*3/uL (ref 0.0–0.1)
BASOS PCT: 0 %
Basophils Absolute: 0 10*3/uL (ref 0.0–0.1)
EOS ABS: 0.2 10*3/uL (ref 0.0–0.7)
EOS PCT: 3 %
HEMATOCRIT: 49.3 % (ref 39.0–52.0)
Hemoglobin: 16.3 g/dL (ref 13.0–17.0)
Immature Granulocytes: 0 %
LYMPHS ABS: 2.9 10*3/uL (ref 0.7–4.0)
Lymphocytes Relative: 37 %
MCH: 29.2 pg (ref 26.0–34.0)
MCHC: 33.1 g/dL (ref 30.0–36.0)
MCV: 88.2 fL (ref 78.0–100.0)
MONOS PCT: 9 %
Monocytes Absolute: 0.7 10*3/uL (ref 0.1–1.0)
Neutro Abs: 4 10*3/uL (ref 1.7–7.7)
Neutrophils Relative %: 51 %
Platelets: 273 10*3/uL (ref 150–400)
RBC: 5.59 MIL/uL (ref 4.22–5.81)
RDW: 12.4 % (ref 11.5–15.5)
WBC: 7.9 10*3/uL (ref 4.0–10.5)

## 2017-10-22 LAB — BASIC METABOLIC PANEL
Anion gap: 10 (ref 5–15)
BUN: 12 mg/dL (ref 6–20)
CALCIUM: 9 mg/dL (ref 8.9–10.3)
CHLORIDE: 104 mmol/L (ref 98–111)
CO2: 24 mmol/L (ref 22–32)
CREATININE: 1.48 mg/dL — AB (ref 0.61–1.24)
GFR calc Af Amer: 59 mL/min — ABNORMAL LOW (ref >60)
GFR calc non Af Amer: 51 mL/min — ABNORMAL LOW (ref >60)
Glucose, Bld: 139 mg/dL — ABNORMAL HIGH (ref 70–99)
Potassium: 3.7 mmol/L (ref 3.5–5.1)
Sodium: 138 mmol/L (ref 135–145)

## 2017-10-22 NOTE — ED Provider Notes (Signed)
Patient placed in Quick Look pathway, seen and evaluated   Chief Complaint: dizzy  HPI:   Jeremiah Lopez is a 57 y.o. male who presents to the ED with feeling of dizziness. Patient reports high blood pressure readings on his wrist device. Reports has been out of his amlodipine for 1 week until yesterday. BP 144/106, HR 120. Pt tachypnic.   ROS: Resp: short of breath  Neuro: dizzy   Physical Exam:  BP (!) 144/106   Pulse (!) 120   Temp 98.3 F (36.8 C) (Oral)   Resp 18   SpO2 95%    Gen: No distress  Neuro: Awake and Alert  Heart: tachycardia  Lungs: without wheezing or rales     Initiation of care has begun. The patient has been counseled on the process, plan, and necessity for staying for the completion/evaluation, and the remainder of the medical screening examination    Janne Napoleon, NP 10/22/17 1350    Sabas Sous, MD 10/22/17 [phone]

## 2017-10-22 NOTE — ED Provider Notes (Signed)
[facility] EMERGENCY DEPARTMENT Provider Note   CSN: [id number] Arrival date & time: 10/22/17  1331     History   Chief Complaint Chief Complaint  Patient presents with  . Hypertension    HPI Jeremiah Lopez is a 57 y.o. male.  The history is provided by the patient and medical records. No language interpreter was used.  Hypertension    Jeremiah Lopez is a 57 y.o. male  with a PMH of HTN who presents to the Emergency Department complaining of elevated blood pressure readings at home. Patient states that this morning, he felt tired and out of energy. He didn't feel like he could do his usual routine and just felt off. It appears that he told Quick Look provider and triage that he was feeling dizzy, but he denies this to me. He checked his blood pressure on his home blood pressure wrist to the 80s where it was reading high.  Patient does state that he compared his blood pressure cuff with her blood pressure devices while here in the ER and that his device is running higher than ours.  He states that he has been out of his blood pressure medicines for a few weeks, but just restarted medication on 9/24.  He was seen by his primary care doctor who started him on 10 mg Norvasc daily.  He took that this morning, an hour or 2 before he checked his blood pressure.  Patient states that when ER initially got to the emergency department, he was feeling a little weird, however since waiting waiting room, his symptoms resolved.  He states that they checked his blood pressure and it was closer to normal, after he saw that, he felt much better.  He denies any headaches, visual changes, shortness of breath, chest pain, abdominal pain, n/v, diaphoresis or syncopal episodes.  Past Medical History:  Diagnosis Date  . Anxiety   . Depression   . Hypertension     Patient Active Problem List   Diagnosis Date Noted  . Hypertension 06/19/2017  . Anxiety 06/19/2017  . Multifocal  pneumonia 06/19/2017  . Mild renal insufficiency 06/19/2017  . CAP (community acquired pneumonia) 06/19/2017    Past Surgical History:  Procedure Laterality Date  . ANKLE SURGERY    . PELVIC FRACTURE SURGERY          Home Medications    Prior to Admission medications   Medication Sig Start Date End Date Taking? Authorizing Provider  albuterol (PROVENTIL HFA;VENTOLIN HFA) 108 (90 Base) MCG/ACT inhaler Inhale 1-2 puffs into the lungs every 6 (six) hours as needed for wheezing or shortness of breath. 06/16/17   Wieters, Hallie C, PA-C  alprazolam (XANAX) 2 MG tablet Take 2 mg by mouth at bedtime as needed for sleep.    [provider]  amLODipine (NORVASC) 10 MG tablet Take 1 tablet (10 mg total) by mouth daily. 06/20/17 06/20/18  Leroy Sea, MD  aspirin EC 81 MG tablet Take 81 mg by mouth every morning.    [provider]  azithromycin (ZITHROMAX) 500 MG tablet Take 1 tablet (500 mg total) by mouth daily. 06/20/17   Leroy Sea, MD  benzonatate (TESSALON) 200 MG capsule Take 1 capsule (200 mg total) by mouth every 8 (eight) hours. Patient not taking: Reported on 06/18/2017 06/16/17   Wieters, Fran Lowes C, PA-C  cyclobenzaprine (FLEXERIL) 10 MG tablet Take 10 mg by mouth 3 (three) times daily as needed for muscle spasms.  [provider]  fluticasone (FLONASE) 50 MCG/ACT nasal spray Place 1-2 sprays into both nostrils daily for 7 days. 06/16/17 06/23/17  Wieters, Hallie C, PA-C  LUMIGAN 0.01 % SOLN Place 1 drop into both eyes at bedtime. 06/15/17   [provider]  montelukast (SINGULAIR) 10 MG tablet Take 1 tablet (10 mg total) by mouth daily. 06/20/17 06/20/18  Leroy Sea, MD  pantoprazole (PROTONIX) 40 MG tablet Take 40 mg by mouth daily as needed for heartburn. 06/12/17   [provider]    Family History Family History  Problem Relation Age of Onset  . Hypertension Mother   . Hypertension Father     Social History Social  History   Tobacco Use  . Smoking status: Former Games developer  . Smokeless tobacco: Never Used  Substance Use Topics  . Alcohol use: Yes  . Drug use: Never     Allergies   Bee venom   Review of Systems Review of Systems  Constitutional: Positive for fatigue.  All other systems reviewed and are negative.    Physical Exam Updated Vital Signs BP (!) 191/108 (BP Location: Right Arm)   Pulse 90   Temp 98 F (36.7 C) (Oral)   Resp 16   SpO2 98%   Physical Exam  Constitutional: He is oriented to person, place, and time. He appears well-developed and well-nourished. No distress.  HENT:  Head: Normocephalic and atraumatic.  Neck: Neck supple. No JVD present.  Cardiovascular: Normal rate, regular rhythm and normal heart sounds.  No murmur heard. Regular rate & rhythm on exam.  Pulmonary/Chest: Effort normal and breath sounds normal. No respiratory distress. He has no rales.  Abdominal: Soft. He exhibits no distension. There is no tenderness.  Musculoskeletal: He exhibits no edema.  Neurological: He is alert and oriented to person, place, and time. No cranial nerve deficit.  Skin: Skin is warm and dry.  Nursing note and vitals reviewed.    ED Treatments / Results  Labs (all labs ordered are listed, but only abnormal results are displayed) Labs Reviewed  BASIC METABOLIC PANEL - Abnormal; Notable for the following components:      Result Value   Glucose, Bld 139 (*)    Creatinine, Ser 1.48 (*)    GFR calc non Af Amer 51 (*)    GFR calc Af Amer 59 (*)    All other components within normal limits  CBC WITH DIFFERENTIAL/PLATELET    EKG EKG Interpretation  Date/Time:  Friday October 22 2017 13:49:38 EDT Ventricular Rate:  109 PR Interval:  136 QRS Duration: 86 QT Interval:  352 QTC Calculation: 474 R Axis:   63 Text Interpretation:  Sinus tachycardia Possible Anterior infarct , age undetermined Abnormal ECG Confirmed by Benjiman Core [phone]) on 10/22/2017 6:06:29  PM   Radiology Dg Chest 2 View  Result Date: 10/22/2017 CLINICAL DATA:  Evaluation of high blood pressure. EXAM: CHEST - 2 VIEW COMPARISON:  06/18/2017. FINDINGS: Mild vascular congestion. Interstitial prominence, favored to represent chronic bronchitic change. Normal heart size. No consolidation or frank pulmonary edema. No bony abnormality. IMPRESSION: Size normal heart size with mild vascular congestion and interstitial prominence favored to represent chronic bronchitic change. Stable appearance from priors. Electronically Signed   By: Elsie Stain M.D.   On: 10/22/2017 14:25    Procedures Procedures (including critical care time)  Medications Ordered in ED Medications - No data to display   Initial Impression / Assessment and Plan / ED Course  I have reviewed  the triage vital signs and the nursing notes.  Pertinent labs & imaging results that were available during my care of the patient were reviewed by me and considered in my medical decision making (see chart for details).    Jeremiah Lopez is a 57 y.o. male who presents to ED for elevated blood pressure. He felt fatigued this morning. He reported dizziness to [facility] provider, but denies dizziness to me. He states that he just felt "off". Patient is currently asymptomatic with no sxs to suggest end organ damage. No chest pain, diaphoresis, nausea or other ACS symptoms. No headache or neurologic complaints. No change in urine output. Normal cardiopulmonary exam. Intact and equal distal pulses. Labs reviewed and reassuring. Creatinine near baseline. Evaluation does not show pathology that would require ongoing emergent intervention or inpatient treatment. Stressed the importance of taking blood pressure medication as directed. He has an appointment with his PCP on Monday and understands to keep this appointment for BP recheck. Reasons to return to ER were discussed and all questions answered.   Patient discussed with Dr.  Rubin Payor who agrees with treatment plan.    Final Clinical Impressions(s) / ED Diagnoses   Final diagnoses:  Essential hypertension    ED Discharge Orders    None       Ward, Chase Picket, PA-C 10/22/17 1832    Benjiman Core, MD 10/23/17 [phone]

## 2017-10-22 NOTE — ED Triage Notes (Signed)
Pt to ER for evaluation of high blood pressure readings on his wrist device. Reports has been out of his amlodipine for 1 week until yesterday. BP 144/106, HR 120. Pt tachypnic.

## 2017-10-22 NOTE — Discharge Instructions (Signed)
It was my pleasure taking care of you today!   Keep your

## 2019-03-26 LAB — COLOGUARD

## 2019-04-13 LAB — COLOGUARD: COLOGUARD: NEGATIVE

## 2019-04-26 ENCOUNTER — Other Ambulatory Visit: Payer: Self-pay | Admitting: Orthopedic Surgery

## 2019-04-26 DIAGNOSIS — M545 Low back pain, unspecified: Secondary | ICD-10-CM

## 2019-04-28 ENCOUNTER — Ambulatory Visit: Payer: Medicare HMO | Attending: Internal Medicine

## 2019-04-28 ENCOUNTER — Ambulatory Visit: Payer: Medicare HMO

## 2019-04-28 DIAGNOSIS — Z23 Encounter for immunization: Secondary | ICD-10-CM

## 2019-04-28 NOTE — Progress Notes (Signed)
   Covid-19 Vaccination Clinic  Name:  Jeremiah Lopez    MRN: [id number] DOB: May 18, 1960  04/28/2019  [name] was observed post Covid-19 immunization for 15 minutes without incident. He was provided with Vaccine Information Sheet and instruction to access the V-Safe system.   [name] was instructed to call 911 with any severe reactions post vaccine: Marland Kitchen Difficulty breathing  . Swelling of face and throat  . A fast heartbeat  . A bad rash all over body  . Dizziness and weakness   Immunizations Administered    Name Date Dose VIS Date Route   Pfizer COVID-19 Vaccine 04/28/2019  1:13 PM 0.3 mL 01/06/2019 Intramuscular   Manufacturer: [employer], [employer]   Lot: [id number]   NDC: [address]-7

## 2019-05-20 IMAGING — CT CT ANGIO CHEST
3 of 7 series · 18 of 36 positions shown · IV contrast (APPLIED)
Comparison: Chest radiograph 06/18/2017

CLINICAL DATA: Shortness of breath and cough

EXAM:
CT ANGIOGRAPHY CHEST WITH CONTRAST
TECHNIQUE: Multidetector CT imaging of the chest was performed using the
standard protocol during bolus administration of intravenous
contrast. Multiplanar CT image reconstructions and MIPs were
obtained to evaluate the vascular anatomy.
CONTRAST:  100mL GHEIKZ-MFB IOPAMIDOL (GHEIKZ-MFB) INJECTION 76%

[Series 8: thins · axial · 0.85mm/px · z∈[+1312,+1540]mm · 15 of 374 slices shown]
[im 24/374  lung]
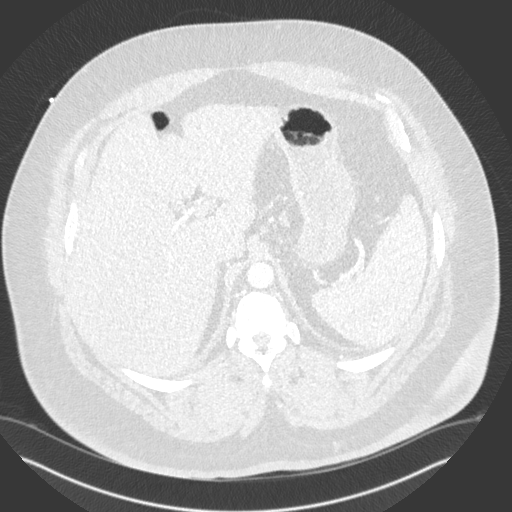
[im 47/374  mediastinal]
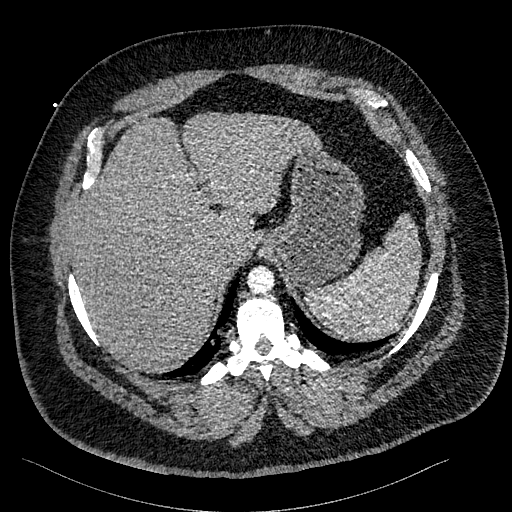
[im 70/374  lung]
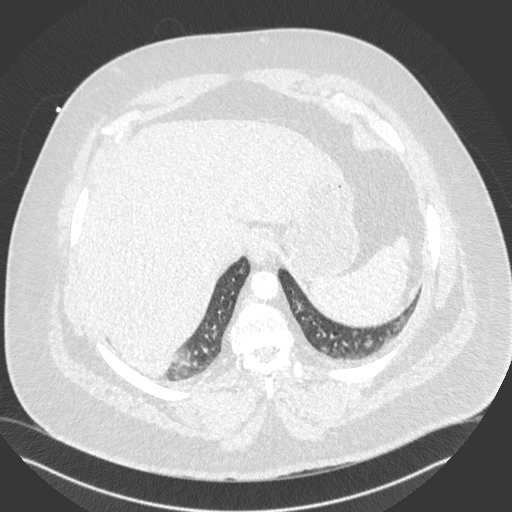
[im 94/374  mediastinal]
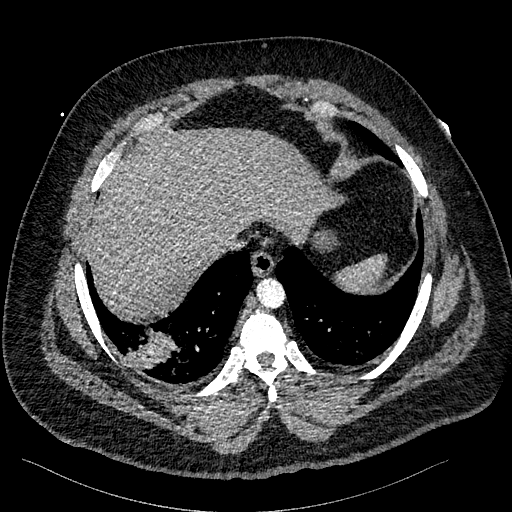
[im 117/374  lung]
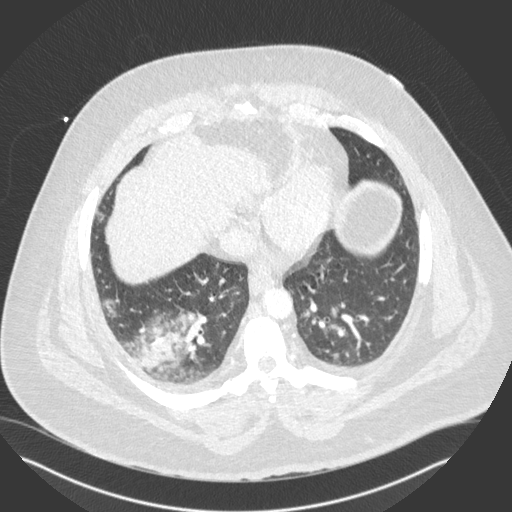
[im 140/374  mediastinal]
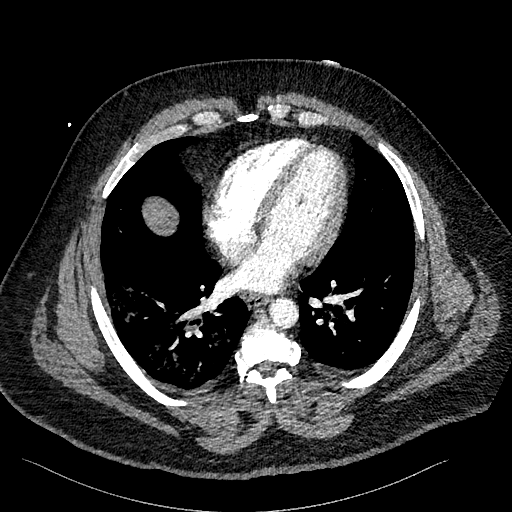
[im 164/374  lung]
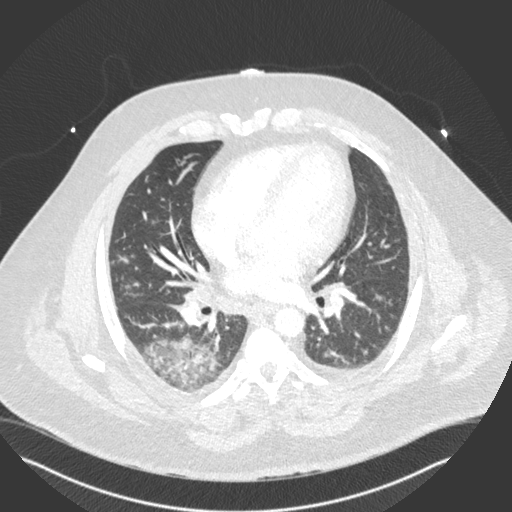
[im 187/374  mediastinal]
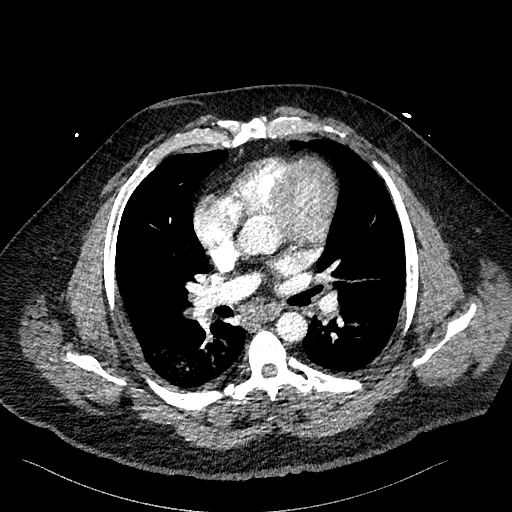
[im 210/374  lung]
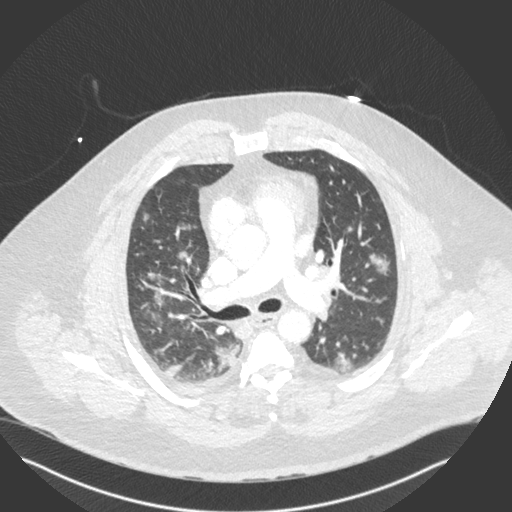
[im 234/374  mediastinal]
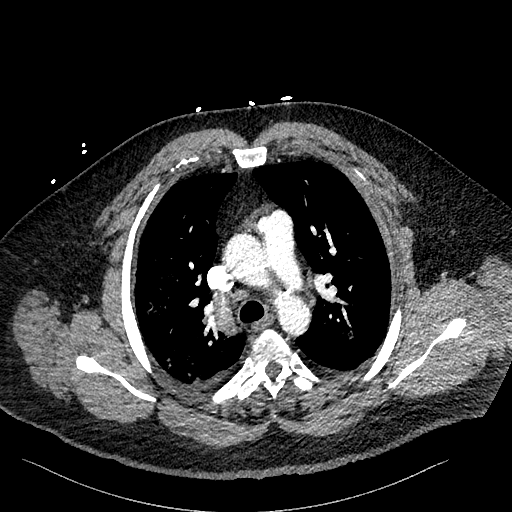
[im 257/374  lung]
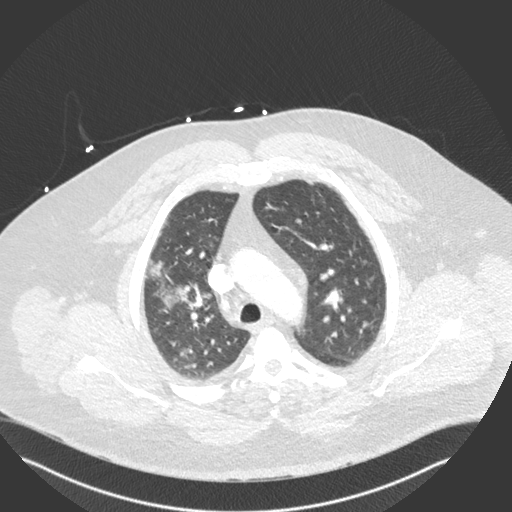
[im 280/374  mediastinal]
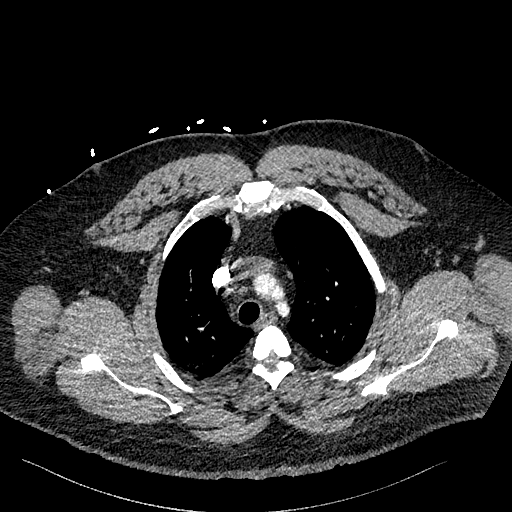
[im 304/374  lung]
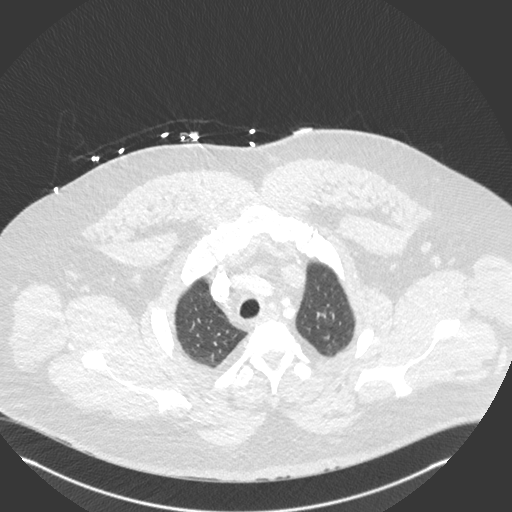
[im 327/374  mediastinal]
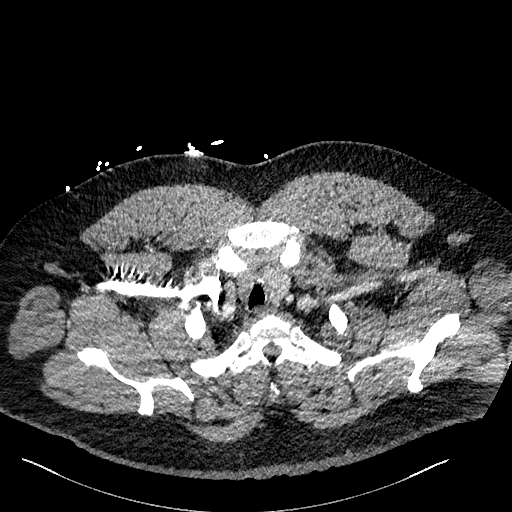
[im 350/374  lung]
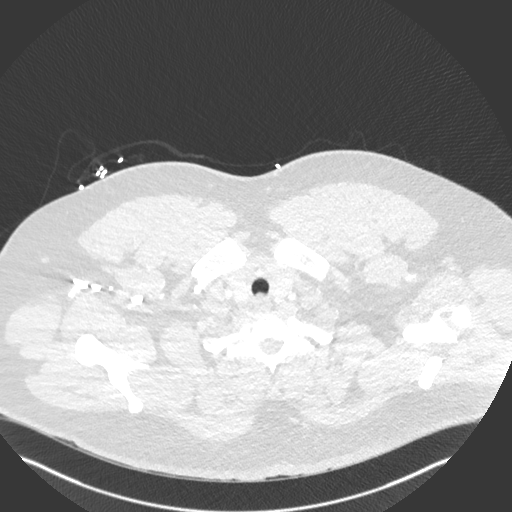

[Series 9: lung · axial · 0.73mm/px · z∈[+1373,+1431]mm · 2 of 117 slices shown]
[im 30/117  mediastinal]
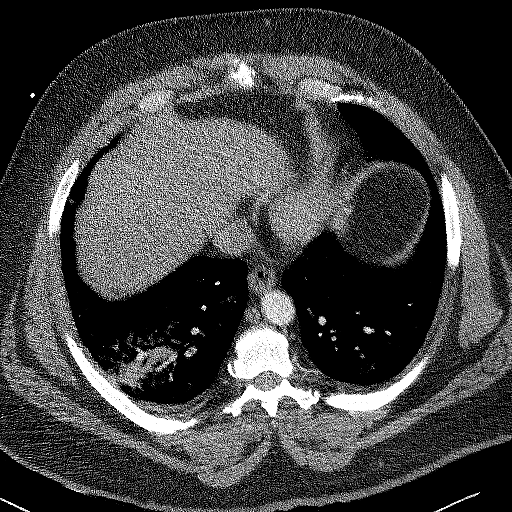
[im 59/117  mediastinal]
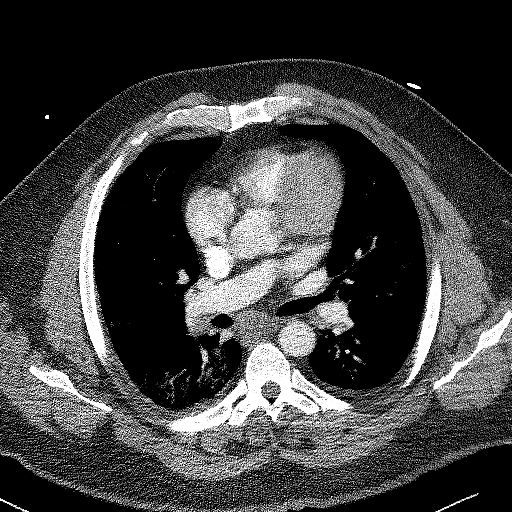

[Series 10: cor · coronal · 0.58mm/px · 1 of 157 slices shown]
[im 79/157  mediastinal]
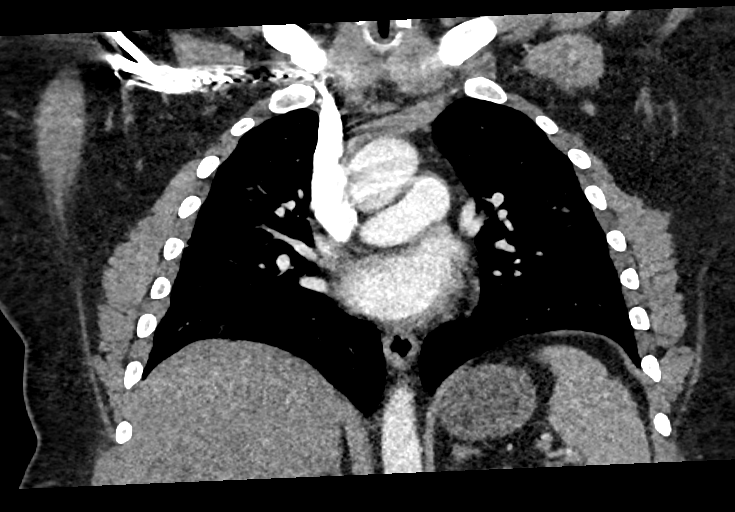

[18 of 36 positions shown; findings below may reference images not displayed]

FINDINGS: Cardiovascular:

--Pulmonary arteries: Contrast injection is sufficient to
demonstrate satisfactory opacification of the pulmonary arteries to
the segmental level. There is no pulmonary embolus. The main
pulmonary artery is within normal limits for size.

--Aorta: Limited opacification of the aorta due to bolus timing
optimization for the pulmonary arteries. Conventional 3 vessel
aortic branching pattern. The aortic course and caliber are normal.
There is no aortic atherosclerosis.

--Heart: Normal size. No pericardial effusion.

Mediastinum/Nodes: No mediastinal, hilar or axillary
lymphadenopathy. The visualized thyroid and thoracic esophageal
course are unremarkable.

Lungs/Pleura: There is multifocal bilateral consolidation, worst in
the right lower lobe. No pleural effusion or pneumothorax.

Upper Abdomen: Contrast bolus timing is not optimized for evaluation
of the abdominal organs. Within this limitation, the visualized
organs of the upper abdomen are normal.

Musculoskeletal: No chest wall abnormality. No acute or significant
osseous findings.

Review of the MIP images confirms the above findings.
IMPRESSION: 1. No pulmonary embolus or acute aortic syndrome.
2. Multifocal bilateral airspace consolidation, worst in the right
lower lobe, consistent with multifocal pneumonia.

## 2019-05-22 ENCOUNTER — Ambulatory Visit: Payer: Medicare HMO | Attending: Internal Medicine

## 2019-05-22 DIAGNOSIS — Z23 Encounter for immunization: Secondary | ICD-10-CM

## 2019-05-22 NOTE — Progress Notes (Signed)
   Covid-19 Vaccination Clinic  Name:  Jeremiah Lopez    MRN: [id number] DOB: 04-Jan-1961  05/22/2019  Jeremiah Lopez was observed post Covid-19 immunization for 15 minutes without incident. He was provided with Vaccine Information Sheet and instruction to access the V-Safe system.   Jeremiah Lopez was instructed to call 911 with any severe reactions post vaccine: Marland Kitchen Difficulty breathing  . Swelling of face and throat  . A fast heartbeat  . A bad rash all over body  . Dizziness and weakness   Immunizations Administered    Name Date Dose VIS Date Route   Pfizer COVID-19 Vaccine 05/22/2019 12:10 PM 0.3 mL 03/22/2018 Intramuscular   Manufacturer: [employer], [employer]   Lot: [id number]   NDC: [address]-3

## 2019-05-23 ENCOUNTER — Other Ambulatory Visit: Payer: Self-pay

## 2019-05-23 ENCOUNTER — Ambulatory Visit
Admission: RE | Admit: 2019-05-23 | Discharge: 2019-05-23 | Disposition: A | Discharge status: Home / Self Care | Payer: Medicare HMO | Source: Ambulatory Visit | Attending: Orthopedic Surgery | Admitting: Orthopedic Surgery

## 2019-05-23 DIAGNOSIS — M545 Low back pain, unspecified: Secondary | ICD-10-CM

## 2019-12-24 ENCOUNTER — Encounter (HOSPITAL_COMMUNITY): Payer: Self-pay | Admitting: Emergency Medicine

## 2019-12-24 ENCOUNTER — Other Ambulatory Visit: Payer: Self-pay

## 2019-12-24 ENCOUNTER — Emergency Department (HOSPITAL_COMMUNITY)
Admission: EM | Admit: 2019-12-24 | Discharge: 2019-12-25 | Disposition: A | Discharge status: Home / Self Care | Payer: Medicare HMO | Attending: Emergency Medicine | Admitting: Emergency Medicine

## 2019-12-24 DIAGNOSIS — I10 Essential (primary) hypertension: Secondary | ICD-10-CM | POA: Insufficient documentation

## 2019-12-24 DIAGNOSIS — Z5321 Procedure and treatment not carried out due to patient leaving prior to being seen by health care provider: Secondary | ICD-10-CM | POA: Insufficient documentation

## 2019-12-24 MED ORDER — LOSARTAN POTASSIUM 50 MG PO TABS
50.0000 mg | ORAL_TABLET | Freq: Once | ORAL | Status: AC
  Administered 2019-12-24: 50 mg via ORAL
  Filled 2019-12-24: qty 1

## 2019-12-24 NOTE — ED Triage Notes (Signed)
Pt reports his home BP machine read high in both arms, minor HA noted, compliant with meds, pt states she did have a change to medications but has not started new regiment.

## 2020-02-15 ENCOUNTER — Other Ambulatory Visit: Payer: Self-pay | Admitting: Internal Medicine

## 2020-02-17 LAB — CBC
HCT: 43.6 % (ref 38.5–50.0)
Hemoglobin: 15 g/dL (ref 13.2–17.1)
MCH: 30.9 pg (ref 27.0–33.0)
MCHC: 34.4 g/dL (ref 32.0–36.0)
MCV: 89.9 fL (ref 80.0–100.0)
MPV: 10.8 fL (ref 7.5–12.5)
Platelets: 290 10*3/uL (ref 140–400)
RBC: 4.85 10*6/uL (ref 4.20–5.80)
RDW: 12.3 % (ref 11.0–15.0)
WBC: 6.4 10*3/uL (ref 3.8–10.8)

## 2020-02-17 LAB — COMPLETE METABOLIC PANEL WITH GFR
AG Ratio: 1.2 (calc) (ref 1.0–2.5)
ALT: 24 U/L (ref 9–46)
AST: 16 U/L (ref 10–35)
Albumin: 3.8 g/dL (ref 3.6–5.1)
Alkaline phosphatase (APISO): 75 U/L (ref 35–144)
BUN/Creatinine Ratio: 10 (calc) (ref 6–22)
BUN: 15 mg/dL (ref 7–25)
CO2: 28 mmol/L (ref 20–32)
Calcium: 8.9 mg/dL (ref 8.6–10.3)
Chloride: 105 mmol/L (ref 98–110)
Creat: 1.45 mg/dL — ABNORMAL HIGH (ref 0.70–1.25)
GFR, Est African American: 60 mL/min/{1.73_m2} (ref >=60)
GFR, Est Non African American: 52 mL/min/{1.73_m2} — ABNORMAL LOW (ref >=60)
Globulin: 3.3 g/dL (calc) (ref 1.9–3.7)
Glucose, Bld: 106 mg/dL — ABNORMAL HIGH (ref 65–99)
Potassium: 4.3 mmol/L (ref 3.5–5.3)
Sodium: 140 mmol/L (ref 135–146)
Total Bilirubin: 0.4 mg/dL (ref 0.2–1.2)
Total Protein: 7.1 g/dL (ref 6.1–8.1)

## 2020-02-17 LAB — SARS-COV-2 RNA,(COVID-19) QUALITATIVE NAAT: SARS CoV2 RNA: NOT DETECTED

## 2020-02-17 LAB — LIPID PANEL
Cholesterol: 211 mg/dL — ABNORMAL HIGH (ref <200)
HDL: 48 mg/dL (ref >=40)
LDL Cholesterol (Calc): 137 mg/dL (calc) — ABNORMAL HIGH
Non-HDL Cholesterol (Calc): 163 mg/dL (calc) — ABNORMAL HIGH (ref <130)
Total CHOL/HDL Ratio: 4.4 (calc) (ref <5.0)
Triglycerides: 132 mg/dL (ref <150)

## 2020-02-17 LAB — PSA: PSA: 0.21 ng/mL (ref <=4.0)

## 2020-02-17 LAB — VITAMIN D 25 HYDROXY (VIT D DEFICIENCY, FRACTURES): Vit D, 25-Hydroxy: 22 ng/mL — ABNORMAL LOW (ref 30–100)

## 2020-02-17 LAB — TSH: TSH: 1.5 mIU/L (ref 0.40–4.50)

## 2020-11-08 ENCOUNTER — Ambulatory Visit (HOSPITAL_COMMUNITY)
Admission: EM | Admit: 2020-11-08 | Discharge: 2020-11-08 | Disposition: A | Discharge status: Home / Self Care | Payer: Medicare HMO | Attending: Urgent Care | Admitting: Urgent Care

## 2020-11-08 ENCOUNTER — Ambulatory Visit (INDEPENDENT_AMBULATORY_CARE_PROVIDER_SITE_OTHER): Payer: Medicare HMO

## 2020-11-08 ENCOUNTER — Encounter (HOSPITAL_COMMUNITY): Payer: Self-pay

## 2020-11-08 DIAGNOSIS — M25552 Pain in left hip: Secondary | ICD-10-CM

## 2020-11-08 DIAGNOSIS — M25551 Pain in right hip: Secondary | ICD-10-CM

## 2020-11-08 DIAGNOSIS — M5136 Other intervertebral disc degeneration, lumbar region: Secondary | ICD-10-CM

## 2020-11-08 DIAGNOSIS — M48061 Spinal stenosis, lumbar region without neurogenic claudication: Secondary | ICD-10-CM | POA: Diagnosis not present

## 2020-11-08 DIAGNOSIS — M545 Low back pain, unspecified: Secondary | ICD-10-CM

## 2020-11-08 DIAGNOSIS — M51369 Other intervertebral disc degeneration, lumbar region without mention of lumbar back pain or lower extremity pain: Secondary | ICD-10-CM

## 2020-11-08 MED ORDER — PREDNISONE 20 MG PO TABS: ORAL_TABLET | ORAL | 0 refills | Status: DC

## 2020-11-08 MED ORDER — TIZANIDINE HCL 4 MG PO TABS: 4.0000 mg | ORAL_TABLET | Freq: Three times a day (TID) | ORAL | 0 refills | Status: DC | PRN

## 2020-11-08 NOTE — Discharge Instructions (Addendum)
We will use a prednisone for your severe back and hip pain. I will call with any results from your x-ray report if they see something I did not.

## 2020-11-08 NOTE — ED Provider Notes (Signed)
[facility]   MRN: [id number] DOB: 1960/10/06  Subjective:   Jeremiah Lopez is a 60 y.o. male presenting for 1 day history of acute severe bilateral hip pain, low back pain.  Symptoms started when he was turning to take a step toward the left and felt excruciating pain shooting down his left leg which then gave out.  He fell on that side.  Has since had difficulty with his bilateral hip pain, radiation of his pain into his thighs.  Has had milder low back pain.  Has a history of degenerative disc disease, bulging disc.  Was recommended to have back surgery but he refused.  Denies history of diabetes.  No fever, nausea, vomiting, incontinence, changes to bowel or urinary habits, saddle paresthesia.  No current facility-administered medications for this encounter.  Current Outpatient Medications:    albuterol (PROVENTIL HFA;VENTOLIN HFA) 108 (90 Base) MCG/ACT inhaler, Inhale 1-2 puffs into the lungs every 6 (six) hours as needed for wheezing or shortness of breath., Disp: 1 Inhaler, Rfl: 0   alprazolam (XANAX) 2 MG tablet, Take 2 mg by mouth at bedtime as needed for sleep., Disp: , Rfl:    amLODipine (NORVASC) 10 MG tablet, Take 1 tablet (10 mg total) by mouth daily., Disp: 30 tablet, Rfl: 11   aspirin EC 81 MG tablet, Take 81 mg by mouth every morning., Disp: , Rfl:    cyclobenzaprine (FLEXERIL) 10 MG tablet, Take 10 mg by mouth 3 (three) times daily as needed for muscle spasms., Disp: , Rfl:    fluticasone (FLONASE) 50 MCG/ACT nasal spray, Place 1-2 sprays into both nostrils daily for 7 days., Disp: 1 g, Rfl: 0   LUMIGAN 0.01 % SOLN, Place 1 drop into both eyes at bedtime., Disp: , Rfl:    montelukast (SINGULAIR) 10 MG tablet, Take 1 tablet (10 mg total) by mouth daily., Disp: 30 tablet, Rfl: 11   pantoprazole (PROTONIX) 40 MG tablet, Take 40 mg by mouth daily as needed for heartburn., Disp: , Rfl:    Allergies  Allergen Reactions   Bee Venom     Past Medical  History:  Diagnosis Date   Anxiety    Depression    Hypertension      Past Surgical History:  Procedure Laterality Date   ANKLE SURGERY     PELVIC FRACTURE SURGERY      Family History  Problem Relation Age of Onset   Hypertension Mother    Hypertension Father     Social History   Tobacco Use   Smoking status: Former   Smokeless tobacco: Never  Substance Use Topics   Alcohol use: Yes   Drug use: Never    ROS   Objective:   Vitals: BP (!) 182/91 (BP Location: Left Arm)   Pulse 93   Temp 98.1 F (36.7 C) (Oral)   Resp 18   SpO2 96%   Physical Exam Constitutional:      General: He is not in acute distress.    Appearance: Normal appearance. He is well-developed. He is obese. He is not ill-appearing, toxic-appearing or diaphoretic.  HENT:     Head: Normocephalic and atraumatic.     Right Ear: External ear normal.     Left Ear: External ear normal.     Nose: Nose normal.     Mouth/Throat:     Pharynx: Oropharynx is clear.  Eyes:     General: No scleral icterus.       Right eye:  No discharge.        Left eye: No discharge.     Extraocular Movements: Extraocular movements intact.     Pupils: Pupils are equal, round, and reactive to light.  Cardiovascular:     Rate and Rhythm: Normal rate.  Pulmonary:     Effort: Pulmonary effort is normal.  Musculoskeletal:     Cervical back: Normal range of motion.     Lumbar back: Tenderness present. No swelling, edema, deformity, signs of trauma, lacerations, spasms or bony tenderness. Decreased range of motion. Negative right straight leg raise test and negative left straight leg raise test. No scoliosis.     Right hip: Tenderness (lateral hip) present. No deformity, lacerations, bony tenderness or crepitus. Decreased range of motion (internal & external rotation, worse over left side). Normal strength.     Left hip: Tenderness (lateral hip) present. No deformity, lacerations, bony tenderness or crepitus. Decreased range  of motion (internal & external rotation, worse over left side). Normal strength.  Neurological:     Mental Status: He is alert and oriented to person, place, and time.     Motor: No weakness.     Coordination: Coordination abnormal (favoring low back).     Gait: Gait normal.     Deep Tendon Reflexes: Reflexes normal.  Psychiatric:        Mood and Affect: Mood normal.        Behavior: Behavior normal.        Thought Content: Thought content normal.        Judgment: Judgment normal.   Degenerative changes of hip bilaterally noted and reviewed with patient in clinic.   MR Lumbar 05/23/2019. IMPRESSION: Lumbar spondylosis has progressed at several levels since prior MRI 11/02/2015. Findings are most notably as follows.   At L4-L5, there is mild grade 1 anterolisthesis. Disc bulge. Advanced facet arthrosis with ligamentum flavum hypertrophy. Moderate bilateral facet joint effusions. Progressive bilateral subarticular narrowing with crowding of the descending L5 nerve roots. Progressive moderate central canal stenosis. Moderate/severe bilateral neural foraminal narrowing (greater on the left) also slightly progressed.   No more than mild spinal canal or neural foraminal narrowing at the remaining levels.   Redemonstrated chronic bilateral sacroiliac ankylosis.   Assessment and Plan :   PDMP not reviewed this encounter.  1. Bilateral hip pain   2. Acute bilateral low back pain without sciatica   3. Spinal stenosis of lumbar region, unspecified whether neurogenic claudication present   4. Bulging lumbar disc     X-ray over-read was pending at time of discharge, recommended follow up with only abnormal results. Otherwise will not call for negative over-read. Patient was in agreement. Will use oral prednisone course given his degenerative changes, significant spinal problems. Follow up with his orthopedist through the [place]. Counseled patient on potential for adverse effects with  medications prescribed/recommended today, ER and return-to-clinic precautions discussed, patient verbalized understanding.    Wallis Bamberg, [place] 11/09/20 [phone]

## 2020-11-08 NOTE — ED Triage Notes (Signed)
Pt c/o chronic bilateral hip pain for years. [facility]. Pt states he fell last from pain shooting down left leg and leg went out.

## 2021-01-21 ENCOUNTER — Emergency Department (HOSPITAL_COMMUNITY)
Admission: EM | Admit: 2021-01-21 | Discharge: 2021-01-21 | Disposition: A | Discharge status: Home / Self Care | Payer: Medicare Other | Attending: Emergency Medicine | Admitting: Emergency Medicine

## 2021-01-21 ENCOUNTER — Emergency Department (HOSPITAL_COMMUNITY): Payer: Medicare Other

## 2021-01-21 ENCOUNTER — Other Ambulatory Visit: Payer: Self-pay

## 2021-01-21 DIAGNOSIS — Z5321 Procedure and treatment not carried out due to patient leaving prior to being seen by health care provider: Secondary | ICD-10-CM | POA: Diagnosis not present

## 2021-01-21 DIAGNOSIS — R0602 Shortness of breath: Secondary | ICD-10-CM | POA: Insufficient documentation

## 2021-01-21 DIAGNOSIS — R112 Nausea with vomiting, unspecified: Secondary | ICD-10-CM | POA: Diagnosis present

## 2021-01-21 DIAGNOSIS — R410 Disorientation, unspecified: Secondary | ICD-10-CM | POA: Diagnosis not present

## 2021-01-21 DIAGNOSIS — R531 Weakness: Secondary | ICD-10-CM | POA: Insufficient documentation

## 2021-01-21 LAB — COMPREHENSIVE METABOLIC PANEL
ALT: 36 U/L (ref 0–44)
AST: 18 U/L (ref 15–41)
Albumin: 3.4 g/dL — ABNORMAL LOW (ref 3.5–5.0)
Alkaline Phosphatase: 65 U/L (ref 38–126)
Anion gap: 7 (ref 5–15)
BUN: 16 mg/dL (ref 6–20)
CO2: 27 mmol/L (ref 22–32)
Calcium: 8.8 mg/dL — ABNORMAL LOW (ref 8.9–10.3)
Chloride: 104 mmol/L (ref 98–111)
Creatinine, Ser: 1.33 mg/dL — ABNORMAL HIGH (ref 0.61–1.24)
GFR, Estimated: >60 mL/min (ref >60)
Glucose, Bld: 116 mg/dL — ABNORMAL HIGH (ref 70–99)
Potassium: 3.7 mmol/L (ref 3.5–5.1)
Sodium: 138 mmol/L (ref 135–145)
Total Bilirubin: 0.4 mg/dL (ref 0.3–1.2)
Total Protein: 7.3 g/dL (ref 6.5–8.1)

## 2021-01-21 LAB — URINALYSIS, ROUTINE W REFLEX MICROSCOPIC
Bacteria, UA: NONE SEEN
Bilirubin Urine: NEGATIVE
Glucose, UA: NEGATIVE mg/dL
Hgb urine dipstick: NEGATIVE
Ketones, ur: NEGATIVE mg/dL
Leukocytes,Ua: NEGATIVE
Nitrite: NEGATIVE
Protein, ur: 100 mg/dL — AB
Specific Gravity, Urine: 1.024 (ref 1.005–1.030)
pH: 5 (ref 5.0–8.0)

## 2021-01-21 LAB — CBC
HCT: 45.6 % (ref 39.0–52.0)
Hemoglobin: 15.4 g/dL (ref 13.0–17.0)
MCH: 31.3 pg (ref 26.0–34.0)
MCHC: 33.8 g/dL (ref 30.0–36.0)
MCV: 92.7 fL (ref 80.0–100.0)
Platelets: 281 10*3/uL (ref 150–400)
RBC: 4.92 MIL/uL (ref 4.22–5.81)
RDW: 12.4 % (ref 11.5–15.5)
WBC: 7.8 10*3/uL (ref 4.0–10.5)
nRBC: 0 % (ref 0.0–0.2)

## 2021-01-21 LAB — LIPASE, BLOOD: Lipase: 36 U/L (ref 11–51)

## 2021-01-21 LAB — BRAIN NATRIURETIC PEPTIDE: B Natriuretic Peptide: 14.6 pg/mL (ref 0.0–100.0)

## 2021-01-21 NOTE — ED Triage Notes (Signed)
Pt had weight loss injections by PCP last week. Pt now complains of weakness, n/v-which are side effects of the medications. Unable to tolerate PO intake.

## 2021-01-21 NOTE — ED Provider Notes (Signed)
Emergency Medicine Provider Triage Evaluation Note  CELVIN TANEY , a 60 y.o. male  was evaluated in triage.  Pt complains of intractable nausea, vomiting for 2 days. Taking nabumetone and tizanidine for pain in hips. Needs bilateral hip replacement, needs to lower weight first. Recently started trulicity for weight loss 3 weeks ago.  His wife at bedside states that he is confused.  Patient does not appear confused to me.  Review of Systems  Positive: Confusion, nausea, vomiting, chills, hip pain Negative: Fevers, diarrhea  Physical Exam  BP (!) 149/98 (BP Location: Left Arm)    Pulse 85    Temp 97.7 F (36.5 C) (Oral)    Resp 18    SpO2 99%  Gen:   Awake, no distress   Resp:  Normal effort  MSK:   Moves extremities without difficulty  Other:  RRR no M/G.  Lungs CTA B.  Abdomen soft, nondistended, nontender.  Patient does appear confused and is thinking.  Medical Decision Making  Medically screening exam initiated at 5:03 PM.  Appropriate orders placed.  DARTAGNAN BEAVERS was informed that the remainder of the evaluation will be completed by another provider, this initial triage assessment does not replace that evaluation, and the importance of remaining in the ED until their evaluation is complete.  Work-up ordered. This chart was dictated using voice recognition software, Dragon. Despite the best efforts of this provider to proofread and correct errors, errors may still occur which can change documentation meaning.    Paris Lore, PA-C 01/21/21 1713    Rozelle Logan, DO 01/21/21 2352

## 2021-01-21 NOTE — ED Notes (Signed)
Pt left AMA °

## 2021-02-18 ENCOUNTER — Encounter (HOSPITAL_BASED_OUTPATIENT_CLINIC_OR_DEPARTMENT_OTHER): Payer: Self-pay

## 2021-02-18 ENCOUNTER — Emergency Department (HOSPITAL_BASED_OUTPATIENT_CLINIC_OR_DEPARTMENT_OTHER)
Admission: EM | Admit: 2021-02-18 | Discharge: 2021-02-18 | Disposition: A | Discharge status: Home / Self Care | Payer: Medicare HMO | Attending: Emergency Medicine | Admitting: Emergency Medicine

## 2021-02-18 ENCOUNTER — Other Ambulatory Visit: Payer: Self-pay

## 2021-02-18 ENCOUNTER — Emergency Department (HOSPITAL_BASED_OUTPATIENT_CLINIC_OR_DEPARTMENT_OTHER): Payer: Medicare HMO | Admitting: Radiology

## 2021-02-18 DIAGNOSIS — R062 Wheezing: Secondary | ICD-10-CM | POA: Diagnosis not present

## 2021-02-18 DIAGNOSIS — R197 Diarrhea, unspecified: Secondary | ICD-10-CM | POA: Insufficient documentation

## 2021-02-18 DIAGNOSIS — R059 Cough, unspecified: Secondary | ICD-10-CM | POA: Diagnosis not present

## 2021-02-18 DIAGNOSIS — D72829 Elevated white blood cell count, unspecified: Secondary | ICD-10-CM | POA: Diagnosis not present

## 2021-02-18 DIAGNOSIS — Z79899 Other long term (current) drug therapy: Secondary | ICD-10-CM | POA: Diagnosis not present

## 2021-02-18 DIAGNOSIS — R0981 Nasal congestion: Secondary | ICD-10-CM | POA: Insufficient documentation

## 2021-02-18 DIAGNOSIS — Z20822 Contact with and (suspected) exposure to covid-19: Secondary | ICD-10-CM | POA: Diagnosis not present

## 2021-02-18 DIAGNOSIS — R0602 Shortness of breath: Secondary | ICD-10-CM | POA: Diagnosis present

## 2021-02-18 DIAGNOSIS — R509 Fever, unspecified: Secondary | ICD-10-CM | POA: Insufficient documentation

## 2021-02-18 LAB — CBC WITH DIFFERENTIAL/PLATELET
Abs Immature Granulocytes: 0.11 10*3/uL — ABNORMAL HIGH (ref 0.00–0.07)
Basophils Absolute: 0 10*3/uL (ref 0.0–0.1)
Basophils Relative: 0 %
Eosinophils Absolute: 0.1 10*3/uL (ref 0.0–0.5)
Eosinophils Relative: 0 %
HCT: 45.1 % (ref 39.0–52.0)
Hemoglobin: 15.3 g/dL (ref 13.0–17.0)
Immature Granulocytes: 1 %
Lymphocytes Relative: 15 %
Lymphs Abs: 2.4 10*3/uL (ref 0.7–4.0)
MCH: 30.6 pg (ref 26.0–34.0)
MCHC: 33.9 g/dL (ref 30.0–36.0)
MCV: 90.2 fL (ref 80.0–100.0)
Monocytes Absolute: 1.2 10*3/uL — ABNORMAL HIGH (ref 0.1–1.0)
Monocytes Relative: 7 %
Neutro Abs: 12.6 10*3/uL — ABNORMAL HIGH (ref 1.7–7.7)
Neutrophils Relative %: 77 %
Platelets: 261 10*3/uL (ref 150–400)
RBC: 5 MIL/uL (ref 4.22–5.81)
RDW: 13 % (ref 11.5–15.5)
WBC: 16.4 10*3/uL — ABNORMAL HIGH (ref 4.0–10.5)
nRBC: 0 % (ref 0.0–0.2)

## 2021-02-18 LAB — BASIC METABOLIC PANEL
Anion gap: 8 (ref 5–15)
BUN: 25 mg/dL — ABNORMAL HIGH (ref 8–23)
CO2: 25 mmol/L (ref 22–32)
Calcium: 9.4 mg/dL (ref 8.9–10.3)
Chloride: 103 mmol/L (ref 98–111)
Creatinine, Ser: 1.4 mg/dL — ABNORMAL HIGH (ref 0.61–1.24)
GFR, Estimated: 57 mL/min — ABNORMAL LOW (ref >60)
Glucose, Bld: 187 mg/dL — ABNORMAL HIGH (ref 70–99)
Potassium: 4 mmol/L (ref 3.5–5.1)
Sodium: 136 mmol/L (ref 135–145)

## 2021-02-18 LAB — RESP PANEL BY RT-PCR (FLU A&B, COVID) ARPGX2
Influenza A by PCR: NEGATIVE
Influenza B by PCR: NEGATIVE
SARS Coronavirus 2 by RT PCR: NEGATIVE

## 2021-02-18 MED ORDER — IPRATROPIUM-ALBUTEROL 0.5-2.5 (3) MG/3ML IN SOLN
3.0000 mL | Freq: Once | RESPIRATORY_TRACT | Status: AC
  Administered 2021-02-18: 15:00:00 3 mL via RESPIRATORY_TRACT
  Filled 2021-02-18: qty 3

## 2021-02-18 MED ORDER — ALBUTEROL SULFATE HFA 108 (90 BASE) MCG/ACT IN AERS: 2.0000 | INHALATION_SPRAY | RESPIRATORY_TRACT | 2 refills | Status: AC | PRN

## 2021-02-18 NOTE — ED Triage Notes (Signed)
Pt reports that he has had pneumonia x3 weeks. Pt has gone through 2 abx. Pt reports he is not getting any better. Pt reports productive cough, low energy, brain fog.

## 2021-02-18 NOTE — ED Provider Notes (Signed)
[facility] EMERGENCY DEPT Provider Note   CSN: [id number] Arrival date & time: 02/18/21  1322     History  Chief Complaint  Patient presents with   Pneumonia    Jeremiah Lopez is a 61 y.o. male presenting to the ED with cough, congestion, shortness of breath.  He reports symptoms ongoing for 3 weeks - wife says everyone in the house had viral URI symptoms at end of December.  He has never recovered.  SOB, dyspnea, cough, subjective fevers.  States PCP put him on 2 courses of antibiotics (he recalls amoxicillin only) and prednisone, but it hasn't helped.  +Diarrhea yesterday.    No chest pressure,no hx of CAD or CHF.  HPI     Home Medications Prior to Admission medications   Medication Sig Start Date End Date Taking? Authorizing Provider  albuterol (VENTOLIN HFA) 108 (90 Base) MCG/ACT inhaler Inhale 2 puffs into the lungs every 4 (four) hours as needed for wheezing or shortness of breath. 02/18/21  Yes Jeremiah Lopez, Carola Rhine, MD  alprazolam Jeremiah Lopez) 2 MG tablet Take 2 mg by mouth at bedtime as needed for sleep.   Yes [provider]  amLODipine (NORVASC) 10 MG tablet Take 1 tablet (10 mg total) by mouth daily. 06/20/17 02/18/21 Yes Jeremiah Lose, MD  cyclobenzaprine (FLEXERIL) 10 MG tablet Take 10 mg by mouth 3 (three) times daily as needed for muscle spasms.   Yes [provider]  fluticasone (FLONASE) 50 MCG/ACT nasal spray Place 1-2 sprays into both nostrils daily for 7 days. 06/16/17 02/18/21 Yes Lopez, Jeremiah C, PA-C  pantoprazole (PROTONIX) 40 MG tablet Take 40 mg by mouth daily as needed for heartburn. 06/12/17  Yes [provider]  tiZANidine (ZANAFLEX) 4 MG tablet Take 1 tablet (4 mg total) by mouth every 8 (eight) hours as needed. 11/08/20  Yes Jeremiah Eagles, PA-C  albuterol (PROVENTIL HFA;VENTOLIN HFA) 108 (90 Base) MCG/ACT inhaler Inhale 1-2 puffs into the lungs every 6 (six) hours as needed for wheezing or shortness of breath. 06/16/17    Lopez, Jeremiah C, PA-C  montelukast (SINGULAIR) 10 MG tablet Take 1 tablet (10 mg total) by mouth daily. 06/20/17 06/20/18  Jeremiah Lose, MD  predniSONE (DELTASONE) 20 MG tablet Take 2 tablets daily with breakfast. 11/08/20   Jeremiah Eagles, PA-C      Allergies    Bee venom    Review of Systems   Review of Systems  Physical Exam Updated Vital Signs BP (!) 149/88 (BP Location: Right Arm)    Pulse 98    Temp 98.2 F (36.8 C)    Resp 18    Ht 6' (1.829 m)    Wt (!) 138.8 kg    SpO2 95%    BMI 41.50 kg/m  Physical Exam Constitutional:      General: He is not in acute distress. HENT:     Head: Normocephalic and atraumatic.  Eyes:     Conjunctiva/sclera: Conjunctivae normal.     Pupils: Pupils are equal, round, and reactive to light.  Cardiovascular:     Rate and Rhythm: Normal rate and regular rhythm.  Pulmonary:     Effort: Pulmonary effort is normal. No respiratory distress.     Breath sounds: Wheezing present.  Abdominal:     General: There is no distension.     Tenderness: There is no abdominal tenderness.  Skin:    General: Skin is warm and dry.  Neurological:     General: No focal deficit  present.     Mental Status: He is alert. Mental status is at baseline.  Psychiatric:        Mood and Affect: Mood normal.        Behavior: Behavior normal.    ED Results / Procedures / Treatments   Labs (all labs ordered are listed, but only abnormal results are displayed) Labs Reviewed  CBC WITH DIFFERENTIAL/PLATELET - Abnormal; Notable for the following components:      Result Value   WBC 16.4 (*)    Neutro Abs 12.6 (*)    Monocytes Absolute 1.2 (*)    Abs Immature Granulocytes 0.11 (*)    All other components within normal limits  BASIC METABOLIC PANEL - Abnormal; Notable for the following components:   Glucose, Bld 187 (*)    BUN 25 (*)    Creatinine, Ser 1.40 (*)    GFR, Estimated 57 (*)    All other components within normal limits  RESP PANEL BY RT-PCR (FLU A&B,  COVID) ARPGX2    EKG EKG Interpretation  Date/Time:  Tuesday February 18 2021 13:51:26 EST Ventricular Rate:  96 PR Interval:  124 QRS Duration: 74 QT Interval:  346 QTC Calculation: 437 R Axis:   48 Text Interpretation: Normal sinus rhythm Normal ECG When compared with ECG of 21-Jan-2021 12:35, No significant change was found Confirmed by Octaviano Glow [phone]) on 02/18/2021 3:02:10 PM  Radiology DG Chest 2 View  Result Date: 02/18/2021 CLINICAL DATA:  Shortness of breath. EXAM: CHEST - 2 VIEW COMPARISON:  January 21, 2021. FINDINGS: The heart size and mediastinal contours are within normal limits. Both lungs are clear. The visualized skeletal structures are unremarkable. IMPRESSION: No active cardiopulmonary disease. Electronically Signed   By: Jeremiah Lopez M.D.   On: 02/18/2021 14:06    Procedures Procedures    Medications Ordered in ED Medications  ipratropium-albuterol (DUONEB) 0.5-2.5 (3) MG/3ML nebulizer solution 3 mL (3 mLs Nebulization Given 02/18/21 1518)    ED Course/ Medical Decision Making/ A&P Clinical Course as of 02/19/21 0918  Tue Feb 18, 2021  1551 Cr near baseline, WBC elevated in setting of recent steroid use - otherwise does not meet SIRS criteria, no localizing symptoms for sepsis. [MT]    Clinical Course User Index [MT] [name], Carola Rhine, MD                           Medical Decision Making Amount and/or Complexity of Data Reviewed Radiology: ordered.  Risk Prescription drug management.   This patient presents to the ED with concern for shortness of breath, cough. This involves an extensive number of treatment options, and is a complaint that carries with it a high risk of complications and morbidity.  The differential diagnosis includes undiagnosed COPD/reactive airway disease vs anemia vs PNA vs viral illness vs other  Additional history obtained from patient's family at bedside  I ordered and personally interpreted labs.  The pertinent  results include:  WBC 16.4 (patient is still on prednisone, likely the cause of leukocytosis); Cr near baseline; K normal; covid/flu negative  I ordered imaging studies including dg chest I independently visualized and interpreted imaging which showed no focal infiltrates, PTX, or significant pulmonary edema I agree with the radiologist interpretation  The patient was maintained on a cardiac monitor.  I personally viewed and interpreted the cardiac monitored which showed an underlying rhythm of: NSR  Per my interpretation the patient's ECG shows NSR  I  ordered medication including duonebs for patient's wheezing I have reviewed the patients home medicines and have made adjustments as needed  Test Considered:  - Lower suspicion clinically for CHF at this time - Low suspicion for PE clinically - did not feel CT PE was indicated at this time - Low suspicion for ACS with this presentation   After the interventions noted above, I reevaluated the patient and found that they have: improved  Dispostion:  After consideration of the diagnostic results and the patients response to treatment, I feel that the patent would benefit from albuterol inhaler at home; continuing and completing steroid course already prescribed; and PCP f/u.         Final Clinical Impression(s) / ED Diagnoses Final diagnoses:  Wheezing    Rx / DC Orders ED Discharge Orders          Ordered    albuterol (VENTOLIN HFA) 108 (90 Base) MCG/ACT inhaler  Every 4 hours PRN        02/18/21 1623              Giana Castner, Carola Rhine, MD 02/19/21 [phone]

## 2021-03-13 ENCOUNTER — Other Ambulatory Visit: Payer: Self-pay | Admitting: Internal Medicine

## 2021-03-14 LAB — VITAMIN D 25 HYDROXY (VIT D DEFICIENCY, FRACTURES): Vit D, 25-Hydroxy: 24 ng/mL — ABNORMAL LOW (ref 30–100)

## 2021-03-14 LAB — LIPID PANEL
Cholesterol: 231 mg/dL — ABNORMAL HIGH (ref <200)
HDL: 53 mg/dL (ref >=40)
LDL Cholesterol (Calc): 160 mg/dL (calc) — ABNORMAL HIGH
Non-HDL Cholesterol (Calc): 178 mg/dL (calc) — ABNORMAL HIGH (ref <130)
Total CHOL/HDL Ratio: 4.4 (calc) (ref <5.0)
Triglycerides: 75 mg/dL (ref <150)

## 2021-03-14 LAB — COMPLETE METABOLIC PANEL WITH GFR
AG Ratio: 1.1 (calc) (ref 1.0–2.5)
ALT: 25 U/L (ref 9–46)
AST: 15 U/L (ref 10–35)
Albumin: 4 g/dL (ref 3.6–5.1)
Alkaline phosphatase (APISO): 74 U/L (ref 35–144)
BUN/Creatinine Ratio: 15 (calc) (ref 6–22)
BUN: 21 mg/dL (ref 7–25)
CO2: 27 mmol/L (ref 20–32)
Calcium: 9.3 mg/dL (ref 8.6–10.3)
Chloride: 101 mmol/L (ref 98–110)
Creat: 1.43 mg/dL — ABNORMAL HIGH (ref 0.70–1.35)
Globulin: 3.7 g/dL (calc) (ref 1.9–3.7)
Glucose, Bld: 89 mg/dL (ref 65–99)
Potassium: 4.3 mmol/L (ref 3.5–5.3)
Sodium: 138 mmol/L (ref 135–146)
Total Bilirubin: 0.4 mg/dL (ref 0.2–1.2)
Total Protein: 7.7 g/dL (ref 6.1–8.1)
eGFR: 56 mL/min/{1.73_m2} — ABNORMAL LOW (ref >=60)

## 2021-03-14 LAB — CBC
HCT: 45.8 % (ref 38.5–50.0)
Hemoglobin: 15.4 g/dL (ref 13.2–17.1)
MCH: 30.5 pg (ref 27.0–33.0)
MCHC: 33.6 g/dL (ref 32.0–36.0)
MCV: 90.7 fL (ref 80.0–100.0)
MPV: 10.2 fL (ref 7.5–12.5)
Platelets: 289 10*3/uL (ref 140–400)
RBC: 5.05 10*6/uL (ref 4.20–5.80)
RDW: 13.3 % (ref 11.0–15.0)
WBC: 7.4 10*3/uL (ref 3.8–10.8)

## 2021-03-14 LAB — PSA: PSA: 0.12 ng/mL (ref <=4.00)

## 2021-03-14 LAB — TSH: TSH: 2.67 mIU/L (ref 0.40–4.50)

## 2021-07-21 NOTE — Progress Notes (Addendum)
Anesthesia Review:  PCP: DR [facility] - pt states cleared by PCP .  Called and requested clearance from [employer]. They are to fax  Clearance on chart dated 06/03/2021 OV 03/16/21 on chart  Cardiologist : none  Chest x-ray : 01/21/21 and 02/18/21  EKG : 02/18/21 , ekg done 06/03/21 on chart  Echo : Stress test: Cardiac Cath :  Activity level: can do a flight of stairs without difficulty  Sleep Study/ CPAP : none  Fasting Blood Sugar :      / Checks Blood Sugar -- times a day:   Blood Thinner/ Instructions /Last Dose: ASA / Instructions/ Last Dose :  DM- type 2  Does not check glucose at home  Hgba1c-  07/23/21- 5.6  BMp done 07/23/21 routed to DR Eulah Pont.

## 2021-07-23 ENCOUNTER — Other Ambulatory Visit: Payer: Self-pay

## 2021-07-23 ENCOUNTER — Encounter (HOSPITAL_COMMUNITY)
Admission: RE | Admit: 2021-07-23 | Discharge: 2021-07-23 | Disposition: A | Discharge status: Home / Self Care | Payer: Medicare HMO | Source: Ambulatory Visit | Attending: Orthopedic Surgery | Admitting: Orthopedic Surgery

## 2021-07-23 ENCOUNTER — Encounter (HOSPITAL_COMMUNITY): Payer: Self-pay

## 2021-07-23 DIAGNOSIS — Z01818 Encounter for other preprocedural examination: Secondary | ICD-10-CM

## 2021-07-23 DIAGNOSIS — E119 Type 2 diabetes mellitus without complications: Secondary | ICD-10-CM | POA: Diagnosis not present

## 2021-07-23 DIAGNOSIS — Z01812 Encounter for preprocedural laboratory examination: Secondary | ICD-10-CM | POA: Diagnosis present

## 2021-07-23 HISTORY — DX: Gastro-esophageal reflux disease without esophagitis: K21.9

## 2021-07-23 HISTORY — DX: Pneumonia, unspecified organism: J18.9

## 2021-07-23 HISTORY — DX: Unspecified osteoarthritis, unspecified site: M19.90

## 2021-07-23 LAB — CBC
HCT: 43.7 % (ref 39.0–52.0)
Hemoglobin: 14.7 g/dL (ref 13.0–17.0)
MCH: 30.8 pg (ref 26.0–34.0)
MCHC: 33.6 g/dL (ref 30.0–36.0)
MCV: 91.4 fL (ref 80.0–100.0)
Platelets: 313 10*3/uL (ref 150–400)
RBC: 4.78 MIL/uL (ref 4.22–5.81)
RDW: 13.2 % (ref 11.5–15.5)
WBC: 8.2 10*3/uL (ref 4.0–10.5)
nRBC: 0 % (ref 0.0–0.2)

## 2021-07-23 LAB — BASIC METABOLIC PANEL
Anion gap: 7 (ref 5–15)
BUN: 19 mg/dL (ref 8–23)
CO2: 25 mmol/L (ref 22–32)
Calcium: 9.1 mg/dL (ref 8.9–10.3)
Chloride: 106 mmol/L (ref 98–111)
Creatinine, Ser: 1.5 mg/dL — ABNORMAL HIGH (ref 0.61–1.24)
GFR, Estimated: 53 mL/min — ABNORMAL LOW (ref >60)
Glucose, Bld: 117 mg/dL — ABNORMAL HIGH (ref 70–99)
Potassium: 3.9 mmol/L (ref 3.5–5.1)
Sodium: 138 mmol/L (ref 135–145)

## 2021-07-23 LAB — HEMOGLOBIN A1C
Hgb A1c MFr Bld: 5.6 % (ref 4.8–5.6)
Mean Plasma Glucose: 114.02 mg/dL

## 2021-07-23 LAB — SURGICAL PCR SCREEN
MRSA, PCR: NEGATIVE
Staphylococcus aureus: POSITIVE — AB

## 2021-07-23 LAB — GLUCOSE, CAPILLARY: Glucose-Capillary: 133 mg/dL — ABNORMAL HIGH (ref 70–99)

## 2021-08-04 NOTE — Anesthesia Preprocedure Evaluation (Signed)
Anesthesia Evaluation  Patient identified by MRN, date of birth, ID band Patient awake    Reviewed: Allergy & Precautions, NPO status , Patient's Chart, lab work & pertinent test results  History of Anesthesia Complications Negative for: history of anesthetic complications  Airway Mallampati: II  TM Distance: >3 FB Neck ROM: Full    Dental no notable dental hx. (+) Dental Advisory Given   Pulmonary neg pulmonary ROS, former smoker,    Pulmonary exam normal        Cardiovascular hypertension, Pt. on medications Normal cardiovascular exam     Neuro/Psych PSYCHIATRIC DISORDERS Anxiety Depression negative neurological ROS     GI/Hepatic Neg liver ROS, GERD  Medicated,  Endo/Other  Morbid obesity  Renal/GU Renal InsufficiencyRenal disease     Musculoskeletal  (+) Arthritis , Osteoarthritis,    Abdominal   Peds  Hematology negative hematology ROS (+)   Anesthesia Other Findings   Reproductive/Obstetrics                            Anesthesia Physical Anesthesia Plan  ASA: 2  Anesthesia Plan: MAC   Post-op Pain Management: Tylenol PO (pre-op)*   Induction:   PONV Risk Score and Plan: 2 and Propofol infusion and Midazolam  Airway Management Planned: Natural Airway, Nasal Cannula and Simple Face Mask  Additional Equipment:   Intra-op Plan:   Post-operative Plan:   Informed Consent: I have reviewed the patients History and Physical, chart, labs and discussed the procedure including the risks, benefits and alternatives for the proposed anesthesia with the patient or authorized representative who has indicated his/her understanding and acceptance.     Dental advisory given  Plan Discussed with: Anesthesiologist and CRNA  Anesthesia Plan Comments:        Anesthesia Quick Evaluation

## 2021-08-05 ENCOUNTER — Other Ambulatory Visit: Payer: Self-pay

## 2021-08-05 ENCOUNTER — Encounter (HOSPITAL_COMMUNITY): Payer: Self-pay | Admitting: Orthopedic Surgery

## 2021-08-05 ENCOUNTER — Ambulatory Visit (HOSPITAL_COMMUNITY): Payer: Medicare HMO

## 2021-08-05 ENCOUNTER — Ambulatory Visit (HOSPITAL_BASED_OUTPATIENT_CLINIC_OR_DEPARTMENT_OTHER): Payer: Medicare HMO | Admitting: Anesthesiology

## 2021-08-05 ENCOUNTER — Observation Stay (HOSPITAL_COMMUNITY)
Admission: RE | Admit: 2021-08-05 | Discharge: 2021-08-06 | Disposition: A | Discharge status: Home / Self Care | Payer: Medicare HMO | Attending: Orthopedic Surgery | Admitting: Orthopedic Surgery

## 2021-08-05 ENCOUNTER — Encounter (HOSPITAL_COMMUNITY)
Admission: RE | Disposition: A | Discharge status: Home / Self Care | Payer: Self-pay | Source: Home / Self Care | Attending: Orthopedic Surgery

## 2021-08-05 ENCOUNTER — Ambulatory Visit (HOSPITAL_COMMUNITY): Payer: Medicare HMO | Admitting: Physician Assistant

## 2021-08-05 DIAGNOSIS — Z87891 Personal history of nicotine dependence: Secondary | ICD-10-CM

## 2021-08-05 DIAGNOSIS — M1612 Unilateral primary osteoarthritis, left hip: Secondary | ICD-10-CM

## 2021-08-05 DIAGNOSIS — I10 Essential (primary) hypertension: Secondary | ICD-10-CM | POA: Insufficient documentation

## 2021-08-05 DIAGNOSIS — Z96642 Presence of left artificial hip joint: Secondary | ICD-10-CM | POA: Diagnosis present

## 2021-08-05 DIAGNOSIS — Z01818 Encounter for other preprocedural examination: Secondary | ICD-10-CM

## 2021-08-05 DIAGNOSIS — Z79899 Other long term (current) drug therapy: Secondary | ICD-10-CM | POA: Insufficient documentation

## 2021-08-05 DIAGNOSIS — F418 Other specified anxiety disorders: Secondary | ICD-10-CM | POA: Diagnosis not present

## 2021-08-05 HISTORY — PX: TOTAL HIP ARTHROPLASTY: SHX124

## 2021-08-05 LAB — TYPE AND SCREEN
ABO/RH(D): A POS
Antibody Screen: NEGATIVE

## 2021-08-05 SURGERY — ARTHROPLASTY, HIP, TOTAL, ANTERIOR APPROACH
Anesthesia: Monitor Anesthesia Care | Site: Hip | Laterality: Left

## 2021-08-05 MED ORDER — POLYETHYLENE GLYCOL 3350 17 G PO PACK: 17.0000 g | PACK | Freq: Every day | ORAL | Status: DC | PRN

## 2021-08-05 MED ORDER — WATER FOR IRRIGATION, STERILE IR SOLN
Status: DC | PRN
  Administered 2021-08-05: 2000 mL

## 2021-08-05 MED ORDER — AMISULPRIDE (ANTIEMETIC) 5 MG/2ML IV SOLN: 10.0000 mg | Freq: Once | INTRAVENOUS | Status: DC | PRN

## 2021-08-05 MED ORDER — OXYCODONE HCL 5 MG PO TABS
10.0000 mg | ORAL_TABLET | ORAL | Status: DC | PRN
  Administered 2021-08-05: 10 mg via ORAL
  Administered 2021-08-05 – 2021-08-06 (×3): 15 mg via ORAL
  Filled 2021-08-05 (×3): qty 3

## 2021-08-05 MED ORDER — ORAL CARE MOUTH RINSE: 15.0000 mL | Freq: Once | OROMUCOSAL | Status: AC

## 2021-08-05 MED ORDER — ONDANSETRON HCL 4 MG/2ML IJ SOLN: 4.0000 mg | Freq: Four times a day (QID) | INTRAMUSCULAR | Status: DC | PRN

## 2021-08-05 MED ORDER — SODIUM CHLORIDE (PF) 0.9 % IJ SOLN
INTRAMUSCULAR | Status: AC
  Filled 2021-08-05: qty 10

## 2021-08-05 MED ORDER — ALBUTEROL SULFATE (2.5 MG/3ML) 0.083% IN NEBU: 2.5000 mg | INHALATION_SOLUTION | RESPIRATORY_TRACT | Status: DC | PRN

## 2021-08-05 MED ORDER — PHENYLEPHRINE HCL (PRESSORS) 10 MG/ML IV SOLN
INTRAVENOUS | Status: DC | PRN
  Administered 2021-08-05: 80 ug via INTRAVENOUS
  Administered 2021-08-05 (×2): 160 ug via INTRAVENOUS

## 2021-08-05 MED ORDER — BUPIVACAINE LIPOSOME 1.3 % IJ SUSP: 10.0000 mL | Freq: Once | INTRAMUSCULAR | Status: DC

## 2021-08-05 MED ORDER — ALPRAZOLAM 1 MG PO TABS
2.0000 mg | ORAL_TABLET | Freq: Every day | ORAL | Status: DC
Start: 2021-08-05 — End: 2021-08-06
  Administered 2021-08-05: 2 mg via ORAL
  Filled 2021-08-05: qty 2

## 2021-08-05 MED ORDER — ONDANSETRON HCL 4 MG PO TABS: 4.0000 mg | ORAL_TABLET | Freq: Four times a day (QID) | ORAL | Status: DC | PRN

## 2021-08-05 MED ORDER — OXYCODONE HCL 5 MG PO TABS
5.0000 mg | ORAL_TABLET | ORAL | Status: DC | PRN
  Administered 2021-08-05: 10 mg via ORAL
  Filled 2021-08-05: qty 2

## 2021-08-05 MED ORDER — ONDANSETRON HCL 4 MG/2ML IJ SOLN
INTRAMUSCULAR | Status: AC
  Filled 2021-08-05: qty 2

## 2021-08-05 MED ORDER — ONDANSETRON HCL 4 MG/2ML IJ SOLN
INTRAMUSCULAR | Status: DC | PRN
  Administered 2021-08-05: 4 mg via INTRAVENOUS

## 2021-08-05 MED ORDER — MIDAZOLAM HCL 5 MG/5ML IJ SOLN
INTRAMUSCULAR | Status: DC | PRN
  Administered 2021-08-05: 2 mg via INTRAVENOUS

## 2021-08-05 MED ORDER — CEFAZOLIN SODIUM-DEXTROSE 2-4 GM/100ML-% IV SOLN
2.0000 g | INTRAVENOUS | Status: AC
  Administered 2021-08-05: 2 g via INTRAVENOUS
  Filled 2021-08-05: qty 100

## 2021-08-05 MED ORDER — HYDROCHLOROTHIAZIDE 12.5 MG PO TABS
12.5000 mg | ORAL_TABLET | Freq: Every day | ORAL | Status: DC
Start: 2021-08-06 — End: 2021-08-06
  Administered 2021-08-06: 12.5 mg via ORAL
  Filled 2021-08-05: qty 1

## 2021-08-05 MED ORDER — CEFAZOLIN SODIUM-DEXTROSE 2-4 GM/100ML-% IV SOLN
2.0000 g | Freq: Four times a day (QID) | INTRAVENOUS | Status: AC
  Administered 2021-08-05 (×2): 2 g via INTRAVENOUS
  Filled 2021-08-05: qty 100

## 2021-08-05 MED ORDER — PROPOFOL 500 MG/50ML IV EMUL
INTRAVENOUS | Status: DC | PRN
  Administered 2021-08-05: 75 ug/kg/min via INTRAVENOUS
  Administered 2021-08-05: 35 ug/kg/min via INTRAVENOUS

## 2021-08-05 MED ORDER — TRAMADOL HCL 50 MG PO TABS
50.0000 mg | ORAL_TABLET | Freq: Four times a day (QID) | ORAL | Status: DC
  Administered 2021-08-05 – 2021-08-06 (×4): 50 mg via ORAL
  Filled 2021-08-05 (×3): qty 1

## 2021-08-05 MED ORDER — METHOCARBAMOL 500 MG IVPB - SIMPLE MED
500.0000 mg | Freq: Four times a day (QID) | INTRAVENOUS | Status: DC | PRN
  Administered 2021-08-05: 500 mg via INTRAVENOUS

## 2021-08-05 MED ORDER — METOCLOPRAMIDE HCL 5 MG PO TABS: 5.0000 mg | ORAL_TABLET | Freq: Three times a day (TID) | ORAL | Status: DC | PRN

## 2021-08-05 MED ORDER — METHOCARBAMOL 500 MG PO TABS
500.0000 mg | ORAL_TABLET | Freq: Four times a day (QID) | ORAL | Status: DC | PRN
  Administered 2021-08-05 – 2021-08-06 (×3): 500 mg via ORAL
  Filled 2021-08-05 (×3): qty 1

## 2021-08-05 MED ORDER — ACETAMINOPHEN 500 MG PO TABS
1000.0000 mg | ORAL_TABLET | Freq: Four times a day (QID) | ORAL | Status: AC
  Administered 2021-08-05 – 2021-08-06 (×4): 1000 mg via ORAL
  Filled 2021-08-05 (×3): qty 2

## 2021-08-05 MED ORDER — MENTHOL 3 MG MT LOZG
1.0000 | LOZENGE | OROMUCOSAL | Status: DC | PRN
Start: 2021-08-05 — End: 2021-08-06

## 2021-08-05 MED ORDER — CHLORHEXIDINE GLUCONATE 0.12 % MT SOLN
15.0000 mL | Freq: Once | OROMUCOSAL | Status: AC
  Administered 2021-08-05: 15 mL via OROMUCOSAL

## 2021-08-05 MED ORDER — ACETAMINOPHEN 325 MG PO TABS: 325.0000 mg | ORAL_TABLET | Freq: Four times a day (QID) | ORAL | Status: DC | PRN

## 2021-08-05 MED ORDER — POVIDONE-IODINE 10 % EX SWAB
2.0000 | Freq: Once | CUTANEOUS | Status: AC
  Administered 2021-08-05: 2 via TOPICAL

## 2021-08-05 MED ORDER — ACETAMINOPHEN 500 MG PO TABS
1000.0000 mg | ORAL_TABLET | Freq: Once | ORAL | Status: AC
  Administered 2021-08-05: 1000 mg via ORAL
  Filled 2021-08-05: qty 2

## 2021-08-05 MED ORDER — DEXAMETHASONE SODIUM PHOSPHATE 10 MG/ML IJ SOLN
10.0000 mg | Freq: Once | INTRAMUSCULAR | Status: AC
  Administered 2021-08-06: 10 mg via INTRAVENOUS
  Filled 2021-08-05: qty 1

## 2021-08-05 MED ORDER — ACETAMINOPHEN 500 MG PO TABS
ORAL_TABLET | ORAL | Status: AC
  Filled 2021-08-05: qty 2

## 2021-08-05 MED ORDER — DEXAMETHASONE SODIUM PHOSPHATE 10 MG/ML IJ SOLN
8.0000 mg | Freq: Once | INTRAMUSCULAR | Status: AC
  Administered 2021-08-05: 10 mg via INTRAVENOUS

## 2021-08-05 MED ORDER — DOCUSATE SODIUM 100 MG PO CAPS
100.0000 mg | ORAL_CAPSULE | Freq: Two times a day (BID) | ORAL | Status: DC
  Administered 2021-08-05 – 2021-08-06 (×2): 100 mg via ORAL
  Filled 2021-08-05 (×2): qty 1

## 2021-08-05 MED ORDER — TRANEXAMIC ACID-NACL 1000-0.7 MG/100ML-% IV SOLN
1000.0000 mg | INTRAVENOUS | Status: AC
  Administered 2021-08-05: 1000 mg via INTRAVENOUS
  Filled 2021-08-05: qty 100

## 2021-08-05 MED ORDER — AMLODIPINE BESYLATE 10 MG PO TABS
10.0000 mg | ORAL_TABLET | Freq: Every day | ORAL | Status: DC
  Administered 2021-08-05 – 2021-08-06 (×2): 10 mg via ORAL
  Filled 2021-08-05 (×2): qty 1

## 2021-08-05 MED ORDER — SODIUM CHLORIDE FLUSH 0.9 % IV SOLN
INTRAVENOUS | Status: DC | PRN
  Administered 2021-08-05: 10 mL via INTRAVENOUS

## 2021-08-05 MED ORDER — BISACODYL 10 MG RE SUPP: 10.0000 mg | Freq: Every day | RECTAL | Status: DC | PRN

## 2021-08-05 MED ORDER — BUPIVACAINE IN DEXTROSE 0.75-8.25 % IT SOLN
INTRATHECAL | Status: DC | PRN
  Administered 2021-08-05: 1.8 mL via INTRATHECAL

## 2021-08-05 MED ORDER — ACETAMINOPHEN 500 MG PO TABS: 1000.0000 mg | ORAL_TABLET | Freq: Once | ORAL | Status: DC

## 2021-08-05 MED ORDER — OXYCODONE HCL 5 MG PO TABS
ORAL_TABLET | ORAL | Status: AC
  Filled 2021-08-05: qty 2

## 2021-08-05 MED ORDER — FENTANYL CITRATE (PF) 100 MCG/2ML IJ SOLN
INTRAMUSCULAR | Status: AC
  Filled 2021-08-05: qty 2

## 2021-08-05 MED ORDER — BUPIVACAINE LIPOSOME 1.3 % IJ SUSP
INTRAMUSCULAR | Status: AC
  Filled 2021-08-05: qty 10

## 2021-08-05 MED ORDER — LAMOTRIGINE 25 MG PO TABS
25.0000 mg | ORAL_TABLET | Freq: Every day | ORAL | Status: DC
  Administered 2021-08-05: 25 mg via ORAL
  Filled 2021-08-05: qty 1

## 2021-08-05 MED ORDER — PHENYLEPHRINE 80 MCG/ML (10ML) SYRINGE FOR IV PUSH (FOR BLOOD PRESSURE SUPPORT)
PREFILLED_SYRINGE | INTRAVENOUS | Status: AC
  Filled 2021-08-05: qty 10

## 2021-08-05 MED ORDER — DIPHENHYDRAMINE HCL 12.5 MG/5ML PO ELIX: 12.5000 mg | ORAL_SOLUTION | ORAL | Status: DC | PRN

## 2021-08-05 MED ORDER — HYDROMORPHONE HCL 1 MG/ML IJ SOLN
0.5000 mg | INTRAMUSCULAR | Status: DC | PRN
  Administered 2021-08-05 (×2): 1 mg via INTRAVENOUS
  Filled 2021-08-05 (×2): qty 1

## 2021-08-05 MED ORDER — NABUMETONE 500 MG PO TABS
750.0000 mg | ORAL_TABLET | Freq: Two times a day (BID) | ORAL | Status: DC | PRN
Start: 2021-08-05 — End: 2021-08-06

## 2021-08-05 MED ORDER — TRAMADOL HCL 50 MG PO TABS
ORAL_TABLET | ORAL | Status: AC
  Filled 2021-08-05: qty 1

## 2021-08-05 MED ORDER — FENTANYL CITRATE PF 50 MCG/ML IJ SOSY
25.0000 ug | PREFILLED_SYRINGE | INTRAMUSCULAR | Status: DC | PRN
  Administered 2021-08-05 (×3): 50 ug via INTRAVENOUS

## 2021-08-05 MED ORDER — LACTATED RINGERS IV SOLN: INTRAVENOUS | Status: DC

## 2021-08-05 MED ORDER — DEXAMETHASONE SODIUM PHOSPHATE 10 MG/ML IJ SOLN
INTRAMUSCULAR | Status: AC
  Filled 2021-08-05: qty 1

## 2021-08-05 MED ORDER — DULAGLUTIDE 0.75 MG/0.5ML ~~LOC~~ SOAJ: 0.7500 mg | SUBCUTANEOUS | Status: DC

## 2021-08-05 MED ORDER — PROMETHAZINE HCL 25 MG/ML IJ SOLN: 6.2500 mg | INTRAMUSCULAR | Status: DC | PRN

## 2021-08-05 MED ORDER — FENTANYL CITRATE (PF) 100 MCG/2ML IJ SOLN
INTRAMUSCULAR | Status: DC | PRN
  Administered 2021-08-05 (×2): 50 ug via INTRAVENOUS

## 2021-08-05 MED ORDER — FENTANYL CITRATE PF 50 MCG/ML IJ SOSY
PREFILLED_SYRINGE | INTRAMUSCULAR | Status: AC
  Filled 2021-08-05: qty 1

## 2021-08-05 MED ORDER — CEFAZOLIN SODIUM-DEXTROSE 2-4 GM/100ML-% IV SOLN
INTRAVENOUS | Status: AC
  Filled 2021-08-05: qty 100

## 2021-08-05 MED ORDER — METHOCARBAMOL 500 MG IVPB - SIMPLE MED
INTRAVENOUS | Status: AC
  Filled 2021-08-05: qty 55

## 2021-08-05 MED ORDER — POVIDONE-IODINE 10 % EX SWAB: 2.0000 | Freq: Once | CUTANEOUS | Status: DC

## 2021-08-05 MED ORDER — PANTOPRAZOLE SODIUM 40 MG PO TBEC
40.0000 mg | DELAYED_RELEASE_TABLET | Freq: Every day | ORAL | Status: DC
  Administered 2021-08-05 – 2021-08-06 (×2): 40 mg via ORAL
  Filled 2021-08-05 (×2): qty 1

## 2021-08-05 MED ORDER — CELECOXIB 200 MG PO CAPS
200.0000 mg | ORAL_CAPSULE | Freq: Two times a day (BID) | ORAL | Status: DC
  Administered 2021-08-05 – 2021-08-06 (×2): 200 mg via ORAL
  Filled 2021-08-05 (×2): qty 1

## 2021-08-05 MED ORDER — TRANEXAMIC ACID-NACL 1000-0.7 MG/100ML-% IV SOLN
INTRAVENOUS | Status: AC
  Filled 2021-08-05: qty 100

## 2021-08-05 MED ORDER — PROPOFOL 1000 MG/100ML IV EMUL
INTRAVENOUS | Status: AC
  Filled 2021-08-05: qty 100

## 2021-08-05 MED ORDER — TRANEXAMIC ACID-NACL 1000-0.7 MG/100ML-% IV SOLN
1000.0000 mg | Freq: Once | INTRAVENOUS | Status: AC
  Administered 2021-08-05: 1000 mg via INTRAVENOUS

## 2021-08-05 MED ORDER — BUPIVACAINE LIPOSOME 1.3 % IJ SUSP
INTRAMUSCULAR | Status: AC
  Filled 2021-08-05: qty 20

## 2021-08-05 MED ORDER — BUPIVACAINE LIPOSOME 1.3 % IJ SUSP
INTRAMUSCULAR | Status: DC | PRN
  Administered 2021-08-05: 10 mL

## 2021-08-05 MED ORDER — ALBUTEROL SULFATE HFA 108 (90 BASE) MCG/ACT IN AERS: 2.0000 | INHALATION_SPRAY | RESPIRATORY_TRACT | Status: DC | PRN

## 2021-08-05 MED ORDER — HYDROMORPHONE HCL 1 MG/ML IJ SOLN
INTRAMUSCULAR | Status: AC
  Administered 2021-08-05: 1 mg via INTRAVENOUS
  Filled 2021-08-05: qty 1

## 2021-08-05 MED ORDER — MIDAZOLAM HCL 2 MG/2ML IJ SOLN
INTRAMUSCULAR | Status: AC
  Filled 2021-08-05: qty 2

## 2021-08-05 MED ORDER — ALUM & MAG HYDROXIDE-SIMETH 200-200-20 MG/5ML PO SUSP
30.0000 mL | ORAL | Status: DC | PRN
  Administered 2021-08-06: 30 mL via ORAL
  Filled 2021-08-05: qty 30

## 2021-08-05 MED ORDER — ASPIRIN 81 MG PO CHEW
81.0000 mg | CHEWABLE_TABLET | Freq: Two times a day (BID) | ORAL | Status: DC
  Administered 2021-08-05 – 2021-08-06 (×2): 81 mg via ORAL
  Filled 2021-08-05 (×2): qty 1

## 2021-08-05 MED ORDER — 0.9 % SODIUM CHLORIDE (POUR BTL) OPTIME
TOPICAL | Status: DC | PRN
  Administered 2021-08-05: 1000 mL

## 2021-08-05 MED ORDER — LOSARTAN POTASSIUM-HCTZ 100-12.5 MG PO TABS: 1.0000 | ORAL_TABLET | Freq: Every day | ORAL | Status: DC

## 2021-08-05 MED ORDER — ESCITALOPRAM OXALATE 10 MG PO TABS
10.0000 mg | ORAL_TABLET | Freq: Every day | ORAL | Status: DC
  Administered 2021-08-05: 10 mg via ORAL
  Filled 2021-08-05: qty 1

## 2021-08-05 MED ORDER — METOCLOPRAMIDE HCL 5 MG/ML IJ SOLN: 5.0000 mg | Freq: Three times a day (TID) | INTRAMUSCULAR | Status: DC | PRN

## 2021-08-05 MED ORDER — PHENOL 1.4 % MT LIQD: 1.0000 | OROMUCOSAL | Status: DC | PRN

## 2021-08-05 MED ORDER — LOSARTAN POTASSIUM 50 MG PO TABS
100.0000 mg | ORAL_TABLET | Freq: Every day | ORAL | Status: DC
  Administered 2021-08-06: 100 mg via ORAL
  Filled 2021-08-05: qty 2

## 2021-08-05 SURGICAL SUPPLY — 45 items
BAG COUNTER SPONGE SURGICOUNT (BAG) IMPLANT
BAG ZIPLOCK 12X15 (MISCELLANEOUS) IMPLANT
BLADE SAG 18X100X1.27 (BLADE) ×2 IMPLANT
BLADE SURG SZ10 CARB STEEL (BLADE) ×2 IMPLANT
CHLORAPREP W/TINT 26 (MISCELLANEOUS) ×2 IMPLANT
CLSR STERI-STRIP ANTIMIC 1/2X4 (GAUZE/BANDAGES/DRESSINGS) ×2 IMPLANT
COVER PERINEAL POST (MISCELLANEOUS) ×2 IMPLANT
COVER SURGICAL LIGHT HANDLE (MISCELLANEOUS) ×2 IMPLANT
DRAPE IMP U-DRAPE 54X76 (DRAPES) ×2 IMPLANT
DRAPE STERI IOBAN 125X83 (DRAPES) ×2 IMPLANT
DRAPE U-SHAPE 47X51 STRL (DRAPES) ×4 IMPLANT
DRSG MEPILEX BORDER 4X8 (GAUZE/BANDAGES/DRESSINGS) ×2 IMPLANT
ELECT REM PT RETURN 15FT ADLT (MISCELLANEOUS) ×2 IMPLANT
GLOVE BIO SURGEON STRL SZ7.5 (GLOVE) ×2 IMPLANT
GLOVE BIOGEL PI IND STRL 7.5 (GLOVE) ×1 IMPLANT
GLOVE BIOGEL PI IND STRL 8 (GLOVE) ×1 IMPLANT
GLOVE BIOGEL PI INDICATOR 7.5 (GLOVE) ×1
GLOVE BIOGEL PI INDICATOR 8 (GLOVE) ×1
GLOVE SURG SYN 7.5  E (GLOVE) ×2
GLOVE SURG SYN 7.5 E (GLOVE) ×1 IMPLANT
GLOVE SURG SYN 7.5 PF PI (GLOVE) ×1 IMPLANT
GOWN SPEC L4 XLG W/TWL (GOWN DISPOSABLE) ×2 IMPLANT
GOWN STRL REUS W/ TWL LRG LVL3 (GOWN DISPOSABLE) ×1 IMPLANT
GOWN STRL REUS W/TWL LRG LVL3 (GOWN DISPOSABLE) ×2
HEAD BIOLOX HIP 36/-5 (Joint) IMPLANT
HIP BIOLOX HD 36/-5 (Joint) ×2 IMPLANT
HOLDER FOLEY CATH W/STRAP (MISCELLANEOUS) ×1 IMPLANT
INSERT TRIDENT POLY 36 0DEG (Insert) ×1 IMPLANT
KIT TURNOVER KIT A (KITS) IMPLANT
MANIFOLD NEPTUNE II (INSTRUMENTS) ×2 IMPLANT
NS IRRIG 1000ML POUR BTL (IV SOLUTION) ×2 IMPLANT
PACK ANTERIOR HIP CUSTOM (KITS) ×2 IMPLANT
PROTECTOR NERVE ULNAR (MISCELLANEOUS) ×2 IMPLANT
SCREW HEX LP 6.5X20 (Screw) ×1 IMPLANT
SHELL ACETABUL CLUSTER SZ 54 (Shell) ×1 IMPLANT
SPIKE FLUID TRANSFER (MISCELLANEOUS) ×4 IMPLANT
STEM HIP 4 127DEG (Stem) ×1 IMPLANT
SUT MNCRL AB 3-0 PS2 18 (SUTURE) ×2 IMPLANT
SUT VIC AB 0 CT1 36 (SUTURE) ×2 IMPLANT
SUT VIC AB 1 CT1 36 (SUTURE) ×2 IMPLANT
SUT VIC AB 2-0 CT1 27 (SUTURE) ×4
SUT VIC AB 2-0 CT1 TAPERPNT 27 (SUTURE) ×2 IMPLANT
TRAY FOLEY MTR SLVR 16FR STAT (SET/KITS/TRAYS/PACK) ×1 IMPLANT
TUBE SUCTION HIGH CAP CLEAR NV (SUCTIONS) ×2 IMPLANT
WATER STERILE IRR 1000ML POUR (IV SOLUTION) ×4 IMPLANT

## 2021-08-05 NOTE — Evaluation (Signed)
Physical Therapy Evaluation Patient Details Name: Jeremiah Lopez MRN: [id number] DOB: Oct 27, 1960 Today's Date: 08/05/2021  History of Present Illness  Pt is a 61yo male presenting s/p L-THA, AA on 08/05/21. PMH: anxiety &depression, GERD, HTN.  Clinical Impression  Jeremiah Lopez is a 60 y.o. male POD 0 s/p L-Tha, AA. Patient reports independence with mobility at baseline; does report history of falling recently where "my legs just gave out on me." Patient is now limited by functional impairments (see PT problem list below) and requires min guard for transfers. Patient was able to ambulate 60 feet with RW and min guard level of assist. Patient instructed in exercise to facilitate ROM and circulation to manage edema. Provided incentive spirometer and with Vcs pt able to achieve 1750 mL. Patient will benefit from continued skilled PT interventions to address impairments and progress towards PLOF. Acute PT will follow to progress mobility and stair training in preparation for safe discharge home.       Recommendations for follow up therapy are one component of a multi-disciplinary discharge planning process, led by the attending physician.  Recommendations may be updated based on patient status, additional functional criteria and insurance authorization.  Follow Up Recommendations Follow physician's recommendations for discharge plan and follow up therapies      Assistance Recommended at Discharge Set up Supervision/Assistance  Patient can return home with the following  A little help with walking and/or transfers;A little help with bathing/dressing/bathroom;Assistance with cooking/housework;Assist for transportation;Help with stairs or ramp for entrance    Equipment Recommendations Rolling walker (2 wheels)  Recommendations for Other Services       Functional Status Assessment Patient has had a recent decline in their functional status and demonstrates the ability to make significant  improvements in function in a reasonable and predictable amount of time.     Precautions / Restrictions Precautions Precautions: Fall Restrictions Weight Bearing Restrictions: No Other Position/Activity Restrictions: WBAT      Mobility  Bed Mobility Overal bed mobility: Modified Independent             General bed mobility comments: Increased time    Transfers Overall transfer level: Needs assistance Equipment used: Rolling walker (2 wheels) Transfers: Sit to/from Stand Sit to Stand: Min guard           General transfer comment: For safety only, no physical assist required, VCs for sequencing.    Ambulation/Gait Ambulation/Gait assistance: Min guard Gait Distance (Feet): 60 Feet Assistive device: Rolling walker (2 wheels) Gait Pattern/deviations: Step-to pattern Gait velocity: decreased     General Gait Details: Pt ambulated 28ft with RW and min guard, no physical assist required or overt LOB noted.  Stairs            Wheelchair Mobility    Modified Rankin (Stroke Patients Only)       Balance Overall balance assessment: Needs assistance Sitting-balance support: Feet supported, No upper extremity supported Sitting balance-Leahy Scale: Good     Standing balance support: No upper extremity supported Standing balance-Leahy Scale: Fair Standing balance comment: Pt able to perform static stance without BUE support, required BUE support on RW during dynamic mobility                             Pertinent Vitals/Pain Pain Assessment Pain Assessment: 0-10 Pain Score: 7  Pain Location: L HIP Pain Descriptors / Indicators: Operative site guarding, Discomfort Pain Intervention(s): Limited activity within patient's tolerance, Monitored  during session, Repositioned, Ice applied    Home Living Family/patient expects to be discharged to:: Private residence Living Arrangements: Spouse/significant other Available Help at Discharge:  Family;Available 24 hours/day Type of Home: House Home Access: Stairs to enter Entrance Stairs-Rails: Right;Left;Can reach both Entrance Stairs-Number of Steps: 4   Home Layout: One level Home Equipment: Shower seat;Grab bars - tub/shower;Cane - single point      Prior Function Prior Level of Function : Independent/Modified Independent;Driving;History of Falls (last six months) (legs just gave way (getting out of the tub, once on the porch))             Mobility Comments: Uses walking stick ADLs Comments: IND     Hand Dominance   Dominant Hand: Right    Extremity/Trunk Assessment   Upper Extremity Assessment Upper Extremity Assessment: Overall WFL for tasks assessed    Lower Extremity Assessment Lower Extremity Assessment: RLE deficits/detail;LLE deficits/detail RLE Deficits / Details: MMT ank DF/PF 5/5 RLE Sensation: WNL LLE Deficits / Details: MMT ank DF/PF 5/5 LLE Sensation: WNL    Cervical / Trunk Assessment Cervical / Trunk Assessment: Normal  Communication   Communication: No difficulties  Cognition Arousal/Alertness: Awake/alert Behavior During Therapy: WFL for tasks assessed/performed, Impulsive Overall Cognitive Status: Within Functional Limits for tasks assessed                                 General Comments: Pt impulsive and moving before PT requests mobility, required reminders and emphasis on safety, RN notified and aware.        General Comments      Exercises Total Joint Exercises Ankle Circles/Pumps: AROM, Both, 10 reps, Seated   Assessment/Plan    PT Assessment Patient needs continued PT services  PT Problem List Decreased strength;Decreased range of motion;Decreased activity tolerance;Decreased balance;Decreased mobility;Decreased coordination;Decreased knowledge of use of DME;Pain       PT Treatment Interventions DME instruction;Gait training;Stair training;Functional mobility training;Therapeutic  activities;Therapeutic exercise;Balance training;Neuromuscular re-education;Patient/family education    PT Goals (Current goals can be found in the Care Plan section)  Acute Rehab PT Goals Patient Stated Goal: to walk without pain PT Goal Formulation: With patient Time For Goal Achievement: 08/12/21 Potential to Achieve Goals: Good    Frequency 7X/week     Co-evaluation               AM-PAC PT "6 Clicks" Mobility  Outcome Measure Help needed turning from your back to your side while in a flat bed without using bedrails?: None Help needed moving from lying on your back to sitting on the side of a flat bed without using bedrails?: None Help needed moving to and from a bed to a chair (including a wheelchair)?: A Little Help needed standing up from a chair using your arms (e.g., wheelchair or bedside chair)?: A Little Help needed to walk in hospital room?: A Little Help needed climbing 3-5 steps with a railing? : A Little 6 Click Score: 20    End of Session Equipment Utilized During Treatment: Gait belt Activity Tolerance: Patient tolerated treatment well;No increased pain Patient left: in chair;with call bell/phone within reach;with chair alarm set;with family/visitor present;with SCD's reapplied Nurse Communication: Mobility status PT Visit Diagnosis: Pain;Difficulty in walking, not elsewhere classified (R26.2) Pain - Right/Left: Left Pain - part of body: Hip    Time: 4008-6761 PT Time Calculation (min) (ACUTE ONLY): 18 min   Charges:   PT Evaluation $PT  Eval Low Complexity: 1 Low          Jamesetta Geralds, Rafael Capo, DPT [facility] Department Office: [phone] Pager: [phone]  Jamesetta Geralds 08/05/2021, 6:56 PM

## 2021-08-05 NOTE — Op Note (Signed)
08/05/2021  9:32 AM  PATIENT:  Jeremiah Lopez   MRN: [id number]  PRE-OPERATIVE DIAGNOSIS:  OA LEFT HIP  POST-OPERATIVE DIAGNOSIS:  OA LEFT HIP  PROCEDURE:  Procedure(s): TOTAL HIP ARTHROPLASTY ANTERIOR APPROACH  PREOPERATIVE INDICATIONS:    Jeremiah Lopez is an 61 y.o. male who has a diagnosis of <principal problem not specified> and elected for surgical management after failing conservative treatment.  The risks benefits and alternatives were discussed with the patient including but not limited to the risks of nonoperative treatment, versus surgical intervention including infection, bleeding, nerve injury, periprosthetic fracture, the need for revision surgery, dislocation, leg length discrepancy, blood clots, cardiopulmonary complications, morbidity, mortality, among others, and they were willing to proceed.     OPERATIVE REPORT     SURGEON:   Renette Butters, MD    ASSISTANT:  Aggie Moats, PA-C, he was present and scrubbed throughout the case, critical for completion in a timely fashion, and for retraction, instrumentation, and closure.     ANESTHESIA:  General    COMPLICATIONS:  None.     COMPONENTS:  [employer] acolade fit femur size 4 with a 36 mm -5 head ball and an acetabular shell size 54 with a  polyethylene liner    PROCEDURE IN DETAIL:   The patient was met in the holding area and  identified.  The appropriate hip was identified and marked at the operative site.  The patient was then transported to the OR  and  placed under anesthesia per that record.  At that point, the patient was  placed in the supine position and  secured to the operating room table and all bony prominences padded. He received pre-operative antibiotics    The operative lower extremity was prepped from the iliac crest to the distal leg.  Sterile draping was performed.  Time out was performed prior to incision.      Skin incision was made just 2 cm lateral to the ASIS  extending in line with  the tensor fascia lata. Electrocautery was used to control all bleeders. I dissected down sharply to the fascia of the tensor fascia lata was confirmed that the muscle fibers beneath were running posteriorly. I then incised the fascia over the superficial tensor fascia lata in line with the incision. The fascia was elevated off the anterior aspect of the muscle the muscle was retracted posteriorly and protected throughout the case. I then used electrocautery to incise the tensor fascia lata fascia control and all bleeders. Immediately visible was the fat over top of the anterior neck and capsule.  I removed the anterior fat from the capsule and elevated the rectus muscle off of the anterior capsule. I then removed a large time of capsule. The retractors were then placed over the anterior acetabulum as well as around the superior and inferior neck.  I then made a femoral neck cut. Then used the power corkscrew to remove the femoral head from the acetabulum and thoroughly irrigated the acetabulum. I sized the femoral head.    I then exposed the deep acetabulum, cleared out any tissue including the ligamentum teres.   After adequate visualization, I excised the labrum, and then sequentially reamed.  I then impacted the acetabular implant into place using fluoroscopy for guidance.  Appropriate version and inclination was confirmed clinically matching their bony anatomy, and with fluoroscopy.  I placed a 20 mm screw in the posterior/superio position with an excellent bite.    I then placed the polyethylene liner in  place  I then adducted the leg and released the external rotators from the posterior femur allowing it to be easily delivered up lateral and anterior to the acetabulum for preparation of the femoral canal.    I then prepared the proximal femur using the cookie-cutter and then sequentially reamed and broached.  A trial broach, neck, and head was utilized, and I reduced the hip and used  floroscopy to assess the neck length and femoral implant.  I then impacted the femoral prosthesis into place into the appropriate version. The hip was then reduced and fluoroscopy confirmed appropriate position. Leg lengths were restored.  I then irrigated the hip copiously again with, and repaired the fascia with Vicryl, followed by monocryl for the subcutaneous tissue, Monocryl for the skin, Steri-Strips and sterile gauze. The patient was then awakened and returned to PACU in stable and satisfactory condition. There were no complications.  POST OPERATIVE PLAN: WBAT, DVT px: SCD's/TED, ambulation and chemical dvt px  Edmonia Lynch, MD Orthopedic Surgeon [phone]

## 2021-08-05 NOTE — Discharge Instructions (Signed)

## 2021-08-05 NOTE — Interval H&P Note (Signed)
History and Physical Interval Note:  08/05/2021 7:11 AM  Jeremiah Lopez  has presented today for surgery, with the diagnosis of OA LEFT HIP.  The various methods of treatment have been discussed with the patient and family. After consideration of risks, benefits and other options for treatment, the patient has consented to  Procedure(s): TOTAL HIP ARTHROPLASTY ANTERIOR APPROACH (Left) as a surgical intervention.  The patient's history has been reviewed, patient examined, no change in status, stable for surgery.  I have reviewed the patient's chart and labs.  Questions were answered to the patient's satisfaction.     Sheral Apley

## 2021-08-05 NOTE — Anesthesia Procedure Notes (Signed)
Spinal  Patient location during procedure: OR Start time: 08/05/2021 7:11 AM End time: 08/05/2021 7:21 AM Reason for block: surgical anesthesia Staffing Performed: anesthesiologist  Anesthesiologist: Heather Roberts, MD Performed by: Heather Roberts, MD Authorized by: Heather Roberts, MD   Preanesthetic Checklist Completed: patient identified, IV checked, risks and benefits discussed, surgical consent, monitors and equipment checked, pre-op evaluation and timeout performed Spinal Block Patient position: sitting Prep: DuraPrep Patient monitoring: cardiac monitor, continuous pulse ox and blood pressure Approach: midline Location: L2-3 Injection technique: single-shot Needle Needle type: Pencan  Needle gauge: 24 G Needle length: 9 cm Assessment Events: CSF return Additional Notes Functioning IV was confirmed and monitors were applied. Sterile prep and drape, including hand hygiene and sterile gloves were used. The patient was positioned and the spine was prepped. The skin was anesthetized with lidocaine.  Free flow of clear CSF was obtained prior to injecting local anesthetic into the CSF.  The spinal needle aspirated freely following injection.  The needle was carefully withdrawn.  The patient tolerated the procedure well.

## 2021-08-05 NOTE — Anesthesia Postprocedure Evaluation (Signed)
Anesthesia Post Note  Patient: Jeremiah Lopez  Procedure(s) Performed: TOTAL HIP ARTHROPLASTY ANTERIOR APPROACH (Left: Hip)     Patient location during evaluation: PACU Anesthesia Type: MAC Level of consciousness: awake and alert Pain management: pain level controlled Vital Signs Assessment: post-procedure vital signs reviewed and stable Respiratory status: spontaneous breathing and respiratory function stable Cardiovascular status: blood pressure returned to baseline and stable Postop Assessment: spinal receding Anesthetic complications: no   No notable events documented.                 Jaysion Ramseyer DANIEL

## 2021-08-05 NOTE — Transfer of Care (Signed)
Immediate Anesthesia Transfer of Care Note  Patient: Jeremiah Lopez  Procedure(s) Performed: TOTAL HIP ARTHROPLASTY ANTERIOR APPROACH (Left: Hip)  Patient Location: PACU  Anesthesia Type:Spinal  Level of Consciousness: awake  Airway & Oxygen Therapy: Patient Spontanous Breathing  Post-op Assessment: Report given to RN  Post vital signs: stable  Last Vitals:  Vitals Value Taken Time  BP 116/88 08/05/21 1000  Temp    Pulse 82 08/05/21 1001  Resp 23 08/05/21 1001  SpO2 94 % 08/05/21 1001  Vitals shown include unvalidated device data.  Last Pain:  Vitals:   08/05/21 0559  TempSrc: Oral  PainSc:          Complications: No notable events documented.

## 2021-08-05 NOTE — Plan of Care (Signed)
  Problem: Pain Management: Goal: Pain level will decrease with appropriate interventions Outcome: Progressing   

## 2021-08-06 DIAGNOSIS — M1612 Unilateral primary osteoarthritis, left hip: Secondary | ICD-10-CM | POA: Diagnosis not present

## 2021-08-06 MED ORDER — METHOCARBAMOL 750 MG PO TABS: 750.0000 mg | ORAL_TABLET | Freq: Three times a day (TID) | ORAL | 0 refills | Status: AC | PRN

## 2021-08-06 MED ORDER — ASPIRIN 81 MG PO TBEC: 81.0000 mg | DELAYED_RELEASE_TABLET | Freq: Two times a day (BID) | ORAL | 0 refills | Status: AC

## 2021-08-06 MED ORDER — OXYCODONE HCL 10 MG PO TABS: 5.0000 mg | ORAL_TABLET | Freq: Three times a day (TID) | ORAL | 0 refills | Status: AC | PRN

## 2021-08-06 MED ORDER — ONDANSETRON 4 MG PO TBDP: 4.0000 mg | ORAL_TABLET | Freq: Two times a day (BID) | ORAL | 0 refills | Status: AC | PRN

## 2021-08-06 MED ORDER — DOCUSATE SODIUM 100 MG PO CAPS: 100.0000 mg | ORAL_CAPSULE | Freq: Two times a day (BID) | ORAL | 0 refills | Status: AC | PRN

## 2021-08-06 MED ORDER — ACETAMINOPHEN 500 MG PO TABS
1000.0000 mg | ORAL_TABLET | Freq: Four times a day (QID) | ORAL | 0 refills | Status: AC | PRN
Start: 2021-08-06 — End: ?

## 2021-08-06 NOTE — Progress Notes (Signed)
    Subjective: Patient reports pain as moderate. Helped with medicine. Tolerating diet.  Urinating.   No CP, SOB.  Has mobilized with PT OOB which went well. Hopeful to go home later today.   Objective:   VITALS:   Vitals:   08/05/21 2116 08/06/21 0147 08/06/21 0633 08/06/21 0923  BP: (!) 151/86 139/83 (!) 151/92 136/82  Pulse: 75 72 66 78  Resp: 18 18 18 19   Temp: 97.9 F (36.6 C) 97.7 F (36.5 C) 97.6 F (36.4 C) 97.6 F (36.4 C)  TempSrc: Oral Oral Oral Oral  SpO2: 100% 97% 99% 96%  Weight:      Height:          Latest Ref Rng & Units 07/23/2021    2:25 PM 03/13/2021   12:00 AM 02/18/2021    3:02 PM  CBC  WBC 4.0 - 10.5 K/uL 8.2  7.4  16.4   Hemoglobin 13.0 - 17.0 g/dL 02/20/2021  61.9  50.9   Hematocrit 39.0 - 52.0 % 43.7  45.8  45.1   Platelets 150 - 400 K/uL 313  289  261       Latest Ref Rng & Units 07/23/2021    2:25 PM 03/13/2021   12:00 AM 02/18/2021    3:02 PM  BMP  Glucose 70 - 99 mg/dL 02/20/2021  89  712   BUN 8 - 23 mg/dL 19  21  25    Creatinine 0.61 - 1.24 mg/dL 458   0.99   BUN/Creat Ratio 6 - 22 (calc)  15    Sodium 135 - 145 mmol/L 138  138  136   Potassium 3.5 - 5.1 mmol/L 3.9  4.3  4.0   Chloride 98 - 111 mmol/L 106  101  103   CO2 22 - 32 mmol/L 25  27  25    Calcium 8.9 - 10.3 mg/dL 9.1  9.3  9.4    Intake/Output      07/11 0701 07/12 0700 07/12 0701 07/13 0700   P.O. 1320 240   I.V. (mL/kg) 1000 (7.7)    IV Piggyback 0    Total Intake(mL/kg) 2320 (17.9) 240 (1.9)   Urine (mL/kg/hr) 5850 (1.9)    Stool 0    Blood 200    Total Output 6050    Net -3730 +240        Urine Occurrence 0 x    Stool Occurrence 0 x       Physical Exam: General: NAD.  Sitting up in bed, just finished breakfast, calm, comfortable Resp: No increased wob Cardio: regular rate and rhythm ABD soft Neurologically intact MSK Neurovascularly intact Sensation intact distally Intact pulses distally Dorsiflexion/Plantar flexion intact Incision: dressing  C/D/I   Assessment: 1 Day Post-Op  S/P Procedure(s) (LRB): TOTAL HIP ARTHROPLASTY ANTERIOR APPROACH (Left) by Dr. 09/12. Murphy on 08/05/21  Principal Problem:   S/P total left hip arthroplasty    Plan:  Advance diet Up with therapy Incentive Spirometry Elevate and Apply ice  Weightbearing: WBAT LLE Insicional and dressing care: Dressings left intact until follow-up and Reinforce dressings as needed Orthopedic device(s): None Showering: Keep dressing dry VTE prophylaxis: Aspirin 81mg  BID  x 30 days , SCDs, ambulation Pain control: continue current regimen Follow - up plan: 2 weeks Contact information:  8/13 MD, Jewel Baize PA-C  Dispo: Home later today as long as he passes PT evaluation.      10/06/21, PA-C Office [phone] 08/06/2021, 10:05 AM

## 2021-08-06 NOTE — Discharge Summary (Signed)
Physician Discharge Summary  Patient ID: Jeremiah Lopez MRN: [id number] DOB/AGE: Jul 08, 1960 61 y.o.  Admit date: 08/05/2021 Discharge date: 08/06/2021  Admission Diagnoses: left hip osteoarthritis  Discharge Diagnoses:  Principal Problem:   S/P total left hip arthroplasty   Discharged Condition: fair  Hospital Course: Patient underwent a left total hip arthroplasty anterior approach by Dr. Eulah Pont on 08/05/21 without complications. He spent the night in observation for pain control and mobilization. He has passed his PT evaluation and is ready for discharge home.   Consults: None  Significant Diagnostic Studies: n/a  Treatments: antibiotics: Ancef, analgesia: acetaminophen, Dilaudid, Morphine, Tramadol, and Oxycodone, anticoagulation: ASA, therapies: PT, and surgery: left THA  Discharge Exam: Blood pressure (!) 146/93, pulse 82, temperature 97.6 F (36.4 C), temperature source Oral, resp. rate 19, height 5\' 11"  (1.803 m), weight 129.7 kg, SpO2 100 %. General appearance: alert, cooperative, and no distress Head: Normocephalic, without obvious abnormality, atraumatic Resp: clear to auscultation bilaterally Cardio: regular rate and rhythm, S1, S2 normal, no murmur, click, rub or gallop Extremities: extremities normal, atraumatic, no cyanosis or edema Pulses:  L brachial 2+ R brachial 2+  L radial 2+ R radial 2+  L inguinal 2+ R inguinal 2+  L popliteal 2+ R popliteal 2+  L posterior tibial 2+ R posterior tibial 2+  L dorsalis pedis 2+ R dorsalis pedis 2+   Neurologic: Grossly normal Incision/Wound: c/d/i  Disposition: Discharge disposition: 01-Home or Self Care       Discharge Instructions     Call MD / Call 911   Complete by: As directed    If you experience chest pain or shortness of breath, CALL 911 and be transported to the hospital emergency room.  If you develope a fever above 101 F, pus (white drainage) or increased drainage or redness at the wound, or calf  pain, call your surgeon's office.   Diet - low sodium heart healthy   Complete by: As directed    Discharge instructions   Complete by: As directed    You may bear weight as tolerated. Keep your dressing on and dry until follow up. Take medicine to prevent blood clots as directed. Take pain medicine as needed with the goal of transitioning to over the counter medicines.    INSTRUCTIONS AFTER JOINT REPLACEMENT   Remove items at home which could result in a fall. This includes throw rugs or furniture in walking pathways ICE to the affected joint every three hours while awake for 30 minutes at a time, for at least the first 3-5 days, and then as needed for pain and swelling.  Continue to use ice for pain and swelling. You may notice swelling that will progress down to the foot and ankle.  This is normal after surgery.  Elevate your leg when you are not up walking on it.   Continue to use the breathing machine you got in the hospital (incentive spirometer) which will help keep your temperature down.  It is common for your temperature to cycle up and down following surgery, especially at night when you are not up moving around and exerting yourself.  The breathing machine keeps your lungs expanded and your temperature down.   DIET:  As you were doing prior to hospitalization, we recommend a well-balanced diet.  DRESSING / WOUND CARE / SHOWERING  You may shower 3 days after surgery, but keep the wounds dry during showering.  You may use an occlusive plastic wrap (Press'n Seal for example) with blue  painter's tape at edges, NO SOAKING/SUBMERGING IN THE BATHTUB.  If the bandage gets wet, call the office.   ACTIVITY  Increase activity slowly as tolerated, but follow the weight bearing instructions below.   No driving for 6 weeks or until further direction given by your physician.  You cannot drive while taking narcotics.  No lifting or carrying greater than 10 lbs. until further directed by your  surgeon. Avoid periods of inactivity such as sitting longer than an hour when not asleep. This helps prevent blood clots.  You may return to work once you are authorized by your doctor.    WEIGHT BEARING   Weight bearing as tolerated with assist device (walker, cane, etc) as directed, use it as long as suggested by your surgeon or therapist, typically at least 4-6 weeks.   EXERCISES  Results after joint replacement surgery are often greatly improved when you follow the exercise, range of motion and muscle strengthening exercises prescribed by your doctor. Safety measures are also important to protect the joint from further injury. Any time any of these exercises cause you to have increased pain or swelling, decrease what you are doing until you are comfortable again and then slowly increase them. If you have problems or questions, call your caregiver or physical therapist for advice.   Rehabilitation is important following a joint replacement. After just a few days of immobilization, the muscles of the leg can become weakened and shrink (atrophy).  These exercises are designed to build up the tone and strength of the thigh and leg muscles and to improve motion. Often times heat used for twenty to thirty minutes before working out will loosen up your tissues and help with improving the range of motion but do not use heat for the first two weeks following surgery (sometimes heat can increase post-operative swelling).   These exercises can be done on a training (exercise) mat, on the floor, on a table or on a bed. Use whatever works the best and is most comfortable for you.    Use music or television while you are exercising so that the exercises are a pleasant break in your day. This will make your life better with the exercises acting as a break in your routine that you can look forward to.   Perform all exercises about fifteen times, three times per day or as directed.  You should exercise both the  operative leg and the other leg as well.  Exercises include:   Quad Sets - Tighten up the muscle on the front of the thigh (Quad) and hold for 5-10 seconds.   Straight Leg Raises - With your knee straight (if you were given a brace, keep it on), lift the leg to 60 degrees, hold for 3 seconds, and slowly lower the leg.  Perform this exercise against resistance later as your leg gets stronger.  Leg Slides: Lying on your back, slowly slide your foot toward your buttocks, bending your knee up off the floor (only go as far as is comfortable). Then slowly slide your foot back down until your leg is flat on the floor again.  Angel Wings: Lying on your back spread your legs to the side as far apart as you can without causing discomfort.  Hamstring Strength:  Lying on your back, push your heel against the floor with your leg straight by tightening up the muscles of your buttocks.  Repeat, but this time bend your knee to a comfortable angle, and push your heel  against the floor.  You may put a pillow under the heel to make it more comfortable if necessary.   A rehabilitation program following joint replacement surgery can speed recovery and prevent re-injury in the future due to weakened muscles. Contact your doctor or a physical therapist for more information on knee rehabilitation.    CONSTIPATION  Constipation is defined medically as fewer than three stools per week and severe constipation as less than one stool per week.  Even if you have a regular bowel pattern at home, your normal regimen is likely to be disrupted due to multiple reasons following surgery.  Combination of anesthesia, postoperative narcotics, change in appetite and fluid intake all can affect your bowels.   YOU MUST use at least one of the following options; they are listed in order of increasing strength to get the job done.  They are all available over the counter, and you may need to use some, POSSIBLY even all of these options:     Drink plenty of fluids (prune juice may be helpful) and high fiber foods Colace 100 mg by mouth twice a day  Senokot for constipation as directed and as needed Dulcolax (bisacodyl), take with full glass of water  Miralax (polyethylene glycol) once or twice a day as needed.  If you have tried all these things and are unable to have a bowel movement in the first 3-4 days after surgery call either your surgeon or your primary doctor.    If you experience loose stools or diarrhea, hold the medications until you stool forms back up.  If your symptoms do not get better within 1 week or if they get worse, check with your doctor.  If you experience "the worst abdominal pain ever" or develop nausea or vomiting, please contact the office immediately for further recommendations for treatment.   ITCHING:  If you experience itching with your medications, try taking only a single pain pill, or even half a pain pill at a time.  You can also use Benadryl over the counter for itching or also to help with sleep.   TED HOSE STOCKINGS:  Use stockings on both legs until for at least 2 weeks or as directed by physician office. They may be removed at night for sleeping.  MEDICATIONS:  See your medication summary on the "After Visit Summary" that nursing will review with you.  You may have some home medications which will be placed on hold until you complete the course of blood thinner medication.  It is important for you to complete the blood thinner medication as prescribed.  Take medicines as prescribed.   You have several different medicines that work in different ways. - Tylenol is for mild to moderate pain. Try to take this medicine before turning to your narcotic medicines.  - Celebrex is to reduce pain / inflammation - Robaxin is for muscle spasms. This medicine can make you drowsy. - Oxycodone is a narcotic pain medicine.  Take this for severe pain. This medicine can be dehydrating / constipating. - Zofran  is for nausea and vomiting. - Colace for constipation prevention - Aspirin is to prevent blood clots after surgery.   PRECAUTIONS:  If you experience chest pain or shortness of breath - call 911 immediately for transfer to the hospital emergency department.   If you develop a fever greater that 101 F, purulent drainage from wound, increased redness or drainage from wound, foul odor from the wound/dressing, or calf pain - CONTACT YOUR SURGEON.  FOLLOW-UP APPOINTMENTS:  If you do not already have a post-op appointment, please call the office [phone] for an appointment to be seen by Dr. Eulah Pont in 2 weeks.   OTHER INSTRUCTIONS:   MAKE SURE YOU:  Understand these instructions.  Get help right away if you are not doing well or get worse.    Thank you for letting us be a part of your medical care team.  It is a privilege we respect greatly.  We hope these instructions will help you stay on track for a fast and full recovery!   Driving restrictions   Complete by: As directed    No driving for 2-4 weeks   Post-operative opioid taper instructions:   Complete by: As directed    POST-OPERATIVE OPIOID TAPER INSTRUCTIONS: It is important to wean off of your opioid medication as soon as possible. If you do not need pain medication after your surgery it is ok to stop day one. Opioids include: Codeine, Hydrocodone(Norco, Vicodin), Oxycodone(Percocet, oxycontin) and hydromorphone amongst others.  Long term and even short term use of opiods can cause: Increased pain response Dependence Constipation Depression Respiratory depression And more.  Withdrawal symptoms can include Flu like symptoms Nausea, vomiting And more Techniques to manage these symptoms Hydrate well Eat regular healthy meals Stay active Use relaxation techniques(deep breathing, meditating, yoga) Do Not substitute Alcohol to help with tapering If you have been on opioids  for less than two weeks and do not have pain than it is ok to stop all together.  Plan to wean off of opioids This plan should start within one week post op of your joint replacement. Maintain the same interval or time between taking each dose and first decrease the dose.  Cut the total daily intake of opioids by one tablet each day Next start to increase the time between doses. The last dose that should be eliminated is the evening dose.      TED hose   Complete by: As directed    Use stockings (TED hose) for 2 weeks on left leg(s).  You may remove them at night for sleeping.   Weight bearing as tolerated   Complete by: As directed       Allergies as of 08/06/2021       Reactions   Bee Venom Swelling        Medication List     STOP taking these medications    predniSONE 20 MG tablet Commonly known as: DELTASONE   tiZANidine 4 MG tablet Commonly known as: Zanaflex       TAKE these medications    acetaminophen 500 MG tablet Commonly known as: TYLENOL Take 2 tablets (1,000 mg total) by mouth every 6 (six) hours as needed for mild pain or moderate pain.   albuterol 108 (90 Base) MCG/ACT inhaler Commonly known as: VENTOLIN HFA Inhale 1-2 puffs into the lungs every 6 (six) hours as needed for wheezing or shortness of breath.   albuterol 108 (90 Base) MCG/ACT inhaler Commonly known as: VENTOLIN HFA Inhale 2 puffs into the lungs every 4 (four) hours as needed for wheezing or shortness of breath.   alprazolam 2 MG tablet Commonly known as: XANAX Take 2 mg by mouth at bedtime.   amLODipine 10 MG tablet Commonly known as: NORVASC Take 1 tablet (10 mg total) by mouth daily.   aspirin EC 81 MG tablet Take 1 tablet (81 mg total) by mouth 2 (two) times daily. To prevent blood clots for 30 days  after surgery.   celecoxib 200 MG capsule Commonly known as: CELEBREX Take 200 mg by mouth 2 (two) times daily.   docusate sodium 100 MG capsule Commonly known as:  Colace Take 1 capsule (100 mg total) by mouth 2 (two) times daily as needed for mild constipation or moderate constipation.   escitalopram 10 MG tablet Commonly known as: LEXAPRO Take 10 mg by mouth at bedtime.   fluticasone 50 MCG/ACT nasal spray Commonly known as: FLONASE Place 1-2 sprays into both nostrils daily for 7 days. What changed:  when to take this reasons to take this   lamoTRIgine 25 MG tablet Commonly known as: LAMICTAL Take 25 mg by mouth at bedtime.   losartan-hydrochlorothiazide 100-12.5 MG tablet Commonly known as: HYZAAR Take 1 tablet by mouth at bedtime.   methocarbamol 750 MG tablet Commonly known as: Robaxin-750 Take 1 tablet (750 mg total) by mouth every 8 (eight) hours as needed for muscle spasms.   montelukast 10 MG tablet Commonly known as: SINGULAIR Take 1 tablet (10 mg total) by mouth daily.   nabumetone 750 MG tablet Commonly known as: RELAFEN Take 750 mg by mouth 2 (two) times daily as needed for moderate pain.   ondansetron 4 MG disintegrating tablet Commonly known as: ZOFRAN-ODT Take 1 tablet (4 mg total) by mouth 2 (two) times daily as needed for nausea or vomiting.   Oxycodone HCl 10 MG Tabs Take 0.5-1 tablets (5-10 mg total) by mouth every 8 (eight) hours as needed (for severe pain after surgery).   pantoprazole 40 MG tablet Commonly known as: PROTONIX Take 40 mg by mouth daily.   Trulicity 0.75 MG/0.5ML Sopn Generic drug: Dulaglutide Inject 0.75 mg into the skin every Wednesday.               Discharge Care Instructions  (From admission, onward)           Start     Ordered   08/06/21 0000  Weight bearing as tolerated        08/06/21 1440            Follow-up Information     Sheral Apley, MD. Go on 08/20/2021.   Specialty: Orthopedic Surgery Why: at 4:15pm Contact information: [address] [place] [address] [phone]                 Signed: Marzetta Board 08/06/2021, 2:40 PM

## 2021-08-06 NOTE — Progress Notes (Signed)
Physical Therapy Treatment Patient Details Name: Jeremiah Lopez MRN: [id number] DOB: 08/11/1960 Today's Date: 08/06/2021   History of Present Illness Pt is a 61yo male presenting s/p L-THA, AA on 08/05/21. PMH: anxiety &depression, GERD, HTN.    PT Comments    Pt ambulated in hallway and practiced safe stair technique with spouse observing. Pt provided with HEP handout.  Pt had no further questions and feels ready for d/c home today.    Recommendations for follow up therapy are one component of a multi-disciplinary discharge planning process, led by the attending physician.  Recommendations may be updated based on patient status, additional functional criteria and insurance authorization.  Follow Up Recommendations  Follow physician's recommendations for discharge plan and follow up therapies     Assistance Recommended at Discharge Set up Supervision/Assistance  Patient can return home with the following A little help with walking and/or transfers;A little help with bathing/dressing/bathroom;Assistance with cooking/housework;Assist for transportation;Help with stairs or ramp for entrance   Equipment Recommendations  Rolling walker (2 wheels)    Recommendations for Other Services       Precautions / Restrictions Precautions Precautions: Fall Restrictions LLE Weight Bearing: Weight bearing as tolerated     Mobility  Bed Mobility Overal bed mobility: Modified Independent                  Transfers Overall transfer level: Needs assistance Equipment used: Rolling walker (2 wheels) Transfers: Sit to/from Stand Sit to Stand: Supervision           General transfer comment: verbal cues for hand placement    Ambulation/Gait Ambulation/Gait assistance: Supervision Gait Distance (Feet): 200 Feet Assistive device: Rolling walker (2 wheels) Gait Pattern/deviations: Step-through pattern, Decreased stance time - left, Antalgic Gait velocity: decreased     General  Gait Details: verbal cues for RW positioning, posture, step length   Stairs Stairs: Yes Stairs assistance: Min guard Stair Management: Two rails, Alternating pattern, Step to pattern, Forwards Number of Stairs: 3 General stair comments: verbal cues for safety and sequence; performed x3; spouse present and observed   Wheelchair Mobility    Modified Rankin (Stroke Patients Only)       Balance                                            Cognition Arousal/Alertness: Awake/alert Behavior During Therapy: WFL for tasks assessed/performed Overall Cognitive Status: Within Functional Limits for tasks assessed                                          Exercises     General Comments        Pertinent Vitals/Pain Pain Assessment Pain Assessment: 0-10 Pain Score: 4  Pain Location: L HIP Pain Descriptors / Indicators: Operative site guarding, Discomfort Pain Intervention(s): Repositioned, Monitored during session    Home Living                          Prior Function            PT Goals (current goals can now be found in the care plan section) Progress towards PT goals: Progressing toward goals    Frequency    7X/week      PT Plan  Current plan remains appropriate    Co-evaluation              AM-PAC PT "6 Clicks" Mobility   Outcome Measure  Help needed turning from your back to your side while in a flat bed without using bedrails?: None Help needed moving from lying on your back to sitting on the side of a flat bed without using bedrails?: None Help needed moving to and from a bed to a chair (including a wheelchair)?: A Little Help needed standing up from a chair using your arms (e.g., wheelchair or bedside chair)?: A Little Help needed to walk in hospital room?: A Little Help needed climbing 3-5 steps with a railing? : A Little 6 Click Score: 20    End of Session Equipment Utilized During Treatment: Gait  belt Activity Tolerance: Patient tolerated treatment well Patient left: in bed;with call bell/phone within reach;with family/visitor present Nurse Communication: Mobility status PT Visit Diagnosis: Difficulty in walking, not elsewhere classified (R26.2)     Time: 4431-5400 PT Time Calculation (min) (ACUTE ONLY): 13 min  Charges:  $Gait Training: 8-22 mins          Paulino Door, DPT Physical Therapist Acute Rehabilitation Services Preferred contact method: Secure Chat Weekend Pager Only: [phone] Office: [phone]    Janan Halter [place] 08/06/2021, 1:49 PM

## 2021-08-06 NOTE — TOC Transition Note (Signed)
Transition of Care [facility]) - CM/SW Discharge Note   Patient Details  Name: Jeremiah Lopez MRN: [id number] Date of Birth: June 26, 1960  [facility] CM/SW Contact:  Lennart Pall, LCSW Phone Number: 08/06/2021, 12:13 PM   Clinical Narrative:    Met with pt and confirming her has received his RW via [place].  OPPT already arranged with [facility].  No further TOC needs.   Final next level of care: OP Rehab Barriers to Discharge: No Barriers Identified   Patient Goals and CMS Choice Patient states their goals for this hospitalization and ongoing recovery are:: return home      Discharge Placement                       Discharge Plan and Services                DME Arranged: Walker rolling DME Agency: [facility]                  Social Determinants of Health (SDOH) Interventions     Readmission Risk Interventions     No data to display

## 2021-08-06 NOTE — Progress Notes (Signed)
Physical Therapy Treatment Patient Details Name: Jeremiah Lopez MRN: [id number] DOB: 08/01/1960 Today's Date: 08/06/2021   History of Present Illness Pt is a 61yo male presenting s/p L-THA, AA on 08/05/21. PMH: anxiety &depression, GERD, HTN.    PT Comments    Pt ambulated in hallway and performed LE exercises.  Pt anticipates d/c home later today after practicing steps.    Recommendations for follow up therapy are one component of a multi-disciplinary discharge planning process, led by the attending physician.  Recommendations may be updated based on patient status, additional functional criteria and insurance authorization.  Follow Up Recommendations  Follow physician's recommendations for discharge plan and follow up therapies     Assistance Recommended at Discharge Set up Supervision/Assistance  Patient can return home with the following A little help with walking and/or transfers;A little help with bathing/dressing/bathroom;Assistance with cooking/housework;Assist for transportation;Help with stairs or ramp for entrance   Equipment Recommendations  Rolling walker (2 wheels)    Recommendations for Other Services       Precautions / Restrictions Precautions Precautions: Fall Restrictions LLE Weight Bearing: Weight bearing as tolerated     Mobility  Bed Mobility Overal bed mobility: Modified Independent                  Transfers Overall transfer level: Needs assistance Equipment used: Rolling walker (2 wheels) Transfers: Sit to/from Stand Sit to Stand: Supervision, Min guard           General transfer comment: verbal cues for hand placement    Ambulation/Gait Ambulation/Gait assistance: Min guard, Supervision Gait Distance (Feet): 60 Feet Assistive device: Rolling walker (2 wheels) Gait Pattern/deviations: Step-to pattern, Step-through pattern, Decreased stance time - left, Antalgic       General Gait Details: verbal cues for RW positioning,  posture, step length   Stairs             Wheelchair Mobility    Modified Rankin (Stroke Patients Only)       Balance                                            Cognition Arousal/Alertness: Awake/alert Behavior During Therapy: WFL for tasks assessed/performed Overall Cognitive Status: Within Functional Limits for tasks assessed                                          Exercises Total Joint Exercises Ankle Circles/Pumps: AROM, Both, 10 reps Quad Sets: AROM, Both, 10 reps Heel Slides: AAROM, Left, 10 reps Hip ABduction/ADduction: AAROM, Left, 10 reps, Standing, Supine Long Arc Quad: AROM, Left, 10 reps, Seated Knee Flexion: AROM, Left, 10 reps, Standing Marching in Standing: AROM, Left, 10 reps, Standing    General Comments        Pertinent Vitals/Pain Pain Assessment Pain Assessment: 0-10 Pain Score: 4  Pain Location: L HIP Pain Descriptors / Indicators: Operative site guarding, Discomfort Pain Intervention(s): Repositioned, Monitored during session    Home Living                          Prior Function            PT Goals (current goals can now be found in the care plan section) Progress towards PT  goals: Progressing toward goals    Frequency           PT Plan Current plan remains appropriate    Co-evaluation              AM-PAC PT "6 Clicks" Mobility   Outcome Measure  Help needed turning from your back to your side while in a flat bed without using bedrails?: None Help needed moving from lying on your back to sitting on the side of a flat bed without using bedrails?: None Help needed moving to and from a bed to a chair (including a wheelchair)?: A Little Help needed standing up from a chair using your arms (e.g., wheelchair or bedside chair)?: A Little Help needed to walk in hospital room?: A Little Help needed climbing 3-5 steps with a railing? : A Little 6 Click Score: 20    End  of Session Equipment Utilized During Treatment: Gait belt Activity Tolerance: Patient tolerated treatment well Patient left: in chair;with call bell/phone within reach Nurse Communication: Mobility status PT Visit Diagnosis: Difficulty in walking, not elsewhere classified (R26.2)     Time: 3009-2330 PT Time Calculation (min) (ACUTE ONLY): 19 min  Charges:  $Therapeutic Exercise: 8-22 mins                    Thomasene Mohair PT, DPT Physical Therapist Acute Rehabilitation Services Preferred contact method: Secure Chat Weekend Pager Only: [phone] Office: [phone]    Janan Halter Payson 08/06/2021, 1:39 PM

## 2021-08-07 ENCOUNTER — Encounter (HOSPITAL_COMMUNITY): Payer: Self-pay | Admitting: Orthopedic Surgery

## 2023-02-24 DIAGNOSIS — Z0000 Encounter for general adult medical examination without abnormal findings: Secondary | ICD-10-CM | POA: Diagnosis not present

## 2023-02-24 DIAGNOSIS — F4312 Post-traumatic stress disorder, chronic: Secondary | ICD-10-CM | POA: Diagnosis not present

## 2023-02-24 DIAGNOSIS — J302 Other seasonal allergic rhinitis: Secondary | ICD-10-CM | POA: Diagnosis not present

## 2023-02-24 DIAGNOSIS — I10 Essential (primary) hypertension: Secondary | ICD-10-CM | POA: Diagnosis not present

## 2023-02-24 DIAGNOSIS — E1165 Type 2 diabetes mellitus with hyperglycemia: Secondary | ICD-10-CM | POA: Diagnosis not present

## 2023-02-24 DIAGNOSIS — E559 Vitamin D deficiency, unspecified: Secondary | ICD-10-CM | POA: Diagnosis not present

## 2023-02-24 DIAGNOSIS — E7849 Other hyperlipidemia: Secondary | ICD-10-CM | POA: Diagnosis not present

## 2023-02-24 DIAGNOSIS — M5126 Other intervertebral disc displacement, lumbar region: Secondary | ICD-10-CM | POA: Diagnosis not present

## 2023-02-24 DIAGNOSIS — N529 Male erectile dysfunction, unspecified: Secondary | ICD-10-CM | POA: Diagnosis not present

## 2023-04-19 ENCOUNTER — Encounter: Payer: Self-pay | Admitting: Gastroenterology

## 2023-04-28 ENCOUNTER — Ambulatory Visit (AMBULATORY_SURGERY_CENTER)

## 2023-04-28 VITALS — Ht 72.0 in | Wt 303.0 lb

## 2023-04-28 DIAGNOSIS — Z1211 Encounter for screening for malignant neoplasm of colon: Secondary | ICD-10-CM

## 2023-04-28 MED ORDER — PEG 3350-KCL-NA BICARB-NACL 420 G PO SOLR: 4000.0000 mL | Freq: Once | ORAL | 0 refills | Status: AC

## 2023-04-28 NOTE — Progress Notes (Signed)
 No egg or soy allergy known to patient  No issues known to pt with past sedation with any surgeries or procedures Patient denies ever being told they had issues or difficulty with intubation  No FH of Malignant Hyperthermia  Pt is not on diet pills (takes Mounjaro, but will switch to Zepbound in 2 weeks, hold instructions provided)  Pt is not on  home 02  Pt is not on blood thinners  Pt denies issues with constipation  No A fib or A flutter Have any cardiac testing pending--No Pt can ambulate with cane assistance   Pt denies use of chewing tobacco Discussed diabetic I weight loss medication holds Discussed NSAID holds Checked BMI Pt instructed to use Single[employer]com or GoodRx for a price reduction on prep  Patient's chart reviewed by Cathlyn Parsons CNRA prior to previsit and patient appropriate for the [facility].  Pre visit completed and red dot placed by patient's name on their procedure day (on provider's schedule).  Patient and spouse were poor historians R/T medication lists.  They were instructed to bring his medications to the procedure and verbalized understanding.

## 2023-05-11 ENCOUNTER — Encounter: Payer: Self-pay | Admitting: Gastroenterology

## 2023-05-17 ENCOUNTER — Ambulatory Visit (AMBULATORY_SURGERY_CENTER): Admitting: Gastroenterology

## 2023-05-17 ENCOUNTER — Encounter: Payer: Self-pay | Admitting: Gastroenterology

## 2023-05-17 ENCOUNTER — Other Ambulatory Visit: Payer: Self-pay | Admitting: Gastroenterology

## 2023-05-17 VITALS — BP 148/94 | HR 78 | Temp 98.6°F | Resp 14 | Ht 72.0 in | Wt 303.0 lb

## 2023-05-17 DIAGNOSIS — K635 Polyp of colon: Secondary | ICD-10-CM

## 2023-05-17 DIAGNOSIS — D125 Benign neoplasm of sigmoid colon: Secondary | ICD-10-CM

## 2023-05-17 DIAGNOSIS — D124 Benign neoplasm of descending colon: Secondary | ICD-10-CM

## 2023-05-17 DIAGNOSIS — Z1211 Encounter for screening for malignant neoplasm of colon: Secondary | ICD-10-CM

## 2023-05-17 MED ORDER — SODIUM CHLORIDE 0.9 % IV SOLN: 500.0000 mL | Freq: Once | INTRAVENOUS | Status: DC

## 2023-05-17 NOTE — Op Note (Signed)
 [facility] Patient Name: Jeremiah Lopez Procedure Date: 05/17/2023 2:20 PM MRN: [id number] Endoscopist: Geralyn Knee E. Cherryl Corona , MD, [id number] Age: 63 Referring MD:  Date of Birth: Nov 21, 1960 Gender: Male Account #: [id number] Procedure:                Colonoscopy Indications:              Screening for colorectal malignant neoplasm, unable                            to locate last colonoscopy note (more recent than                            10 years ago) Medicines:                Monitored Anesthesia Care Procedure:                Pre-Anesthesia Assessment:                           - Prior to the procedure, a History and Physical                            was performed, and patient medications and                            allergies were reviewed. The patient's tolerance of                            previous anesthesia was also reviewed. The risks                            and benefits of the procedure and the sedation                            options and risks were discussed with the patient.                            All questions were answered, and informed consent                            was obtained. Prior Anticoagulants: The patient has                            taken no anticoagulant or antiplatelet agents. ASA                            Grade Assessment: III - A patient with severe                            systemic disease. After reviewing the risks and                            benefits, the patient was deemed in satisfactory  condition to undergo the procedure.                           After obtaining informed consent, the colonoscope                            was passed under direct vision. Throughout the                            procedure, the patient's blood pressure, pulse, and                            oxygen saturations were monitored continuously. The                            Olympus Scope [id number] was  introduced through the                            anus and advanced to the the terminal ileum, with                            identification of the appendiceal orifice and IC                            valve. The colonoscopy was performed without                            difficulty. The patient tolerated the procedure                            well. The quality of the bowel preparation was                            adequate. The terminal ileum, ileocecal valve,                            appendiceal orifice, and rectum were photographed.                            The bowel preparation used was GoLYTELY  via split                            dose instruction. Scope In: 2:31:22 PM Scope Out: 2:49:03 PM Scope Withdrawal Time: 0 hours 15 minutes 26 seconds  Total Procedure Duration: 0 hours 17 minutes 41 seconds  Findings:                 The perianal and digital rectal examinations were                            normal. Pertinent negatives include normal                            sphincter tone and no palpable rectal lesions.  A 7 mm polyp was found in the descending colon. The                            polyp was flat. The polyp was removed with a cold                            snare. Resection and retrieval were complete.                            Estimated blood loss was minimal.                           Multiple (5-10) flat polyps were found in the                            sigmoid colon. The polyps were 2 to 10 mm in size.                            Two of these polyps were removed with a cold snare.                            Resection and retrieval were complete. Estimated                            blood loss was minimal.                           The exam was otherwise normal throughout the                            examined colon.                           The terminal ileum appeared normal.                           The retroflexed view of the  distal rectum and anal                            verge was normal and showed no anal or rectal                            abnormalities. Complications:            No immediate complications. Estimated Blood Loss:     Estimated blood loss was minimal. Impression:               - One 7 mm polyp in the descending colon, removed                            with a cold snare. Resected and retrieved.                           - Multiple 2 to 10 mm polyps in the  sigmoid colon,                            removed with a cold snare. Two were resected and                            retrieved. These appeared consistent with                            hyperplastic polyps.                           - The examined portion of the ileum was normal.                           - The distal rectum and anal verge are normal on                            retroflexion view. Recommendation:           - Patient has a contact number available for                            emergencies. The signs and symptoms of potential                            delayed complications were discussed with the                            patient. Return to normal activities tomorrow.                            Written discharge instructions were provided to the                            patient.                           - Resume previous diet.                           - Continue present medications.                           - Await pathology results.                           - Repeat colonoscopy (date not yet determined) for                            surveillance based on pathology results. Onis Markoff E. Cherryl Corona, MD 05/17/2023 3:01:13 PM This report has been signed electronically.

## 2023-05-17 NOTE — Progress Notes (Signed)
 [facility] History and Physical   Primary Care Physician:  Charle Congo, MD   Reason for Procedure:   Colon cancer screening  Plan:    Screening colonoscopy     HPI: Jeremiah Lopez is a 63 y.o. male undergoing average risk screening colonoscopy.  He has no family history of colon cancer and no chronic GI symptoms.  He reports having a previous colonoscopy around 10 years ago, does not believe any polyps were removed.   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 Authorizing Provider  acetaminophen  (TYLENOL ) 500 MG tablet Take 2 tablets (1,000 mg total) by mouth every 6 (six) hours as needed for mild pain or moderate pain. 08/06/21  Yes Gawne, Meghan M, PA-C  alprazolam  (XANAX ) 2 MG tablet Take 2 mg by mouth at bedtime.   Yes [provider]  alprazolam  (XANAX ) 2 MG tablet Take 1 tablet by mouth at bedtime. 04/20/23  Yes [provider]  aspirin  EC 81 MG tablet Take 1 tablet (81 mg total) by mouth 2 (two) times daily. To prevent blood clots for 30 days after surgery. 08/06/21  Yes Gawne, Meghan M, PA-C  dorzolamide-timolol (COSOPT) 2-0.5 % ophthalmic solution Place 1 drop into both eyes 2 (two) times daily. 03/02/23  Yes [provider]  escitalopram  (LEXAPRO ) 10 MG tablet Take 10 mg by mouth at bedtime. 07/12/21  Yes [provider]  hydrochlorothiazide  (HYDRODIURIL ) 25 MG tablet Take 25 mg by mouth daily. 03/10/23  Yes [provider]  lamoTRIgine   (LAMICTAL ) 25 MG tablet Take 25 mg by mouth at bedtime. 07/12/21  Yes [provider]  latanoprost  (XALATAN ) 0.005 % ophthalmic solution Place 1 drop into both eyes at bedtime. 04/18/23  Yes [provider]  losartan -hydrochlorothiazide  (HYZAAR) 100-12.5 MG tablet Take 1 tablet by mouth at bedtime. 07/12/21  Yes [provider]  meloxicam (MOBIC) 7.5 MG tablet Take 7.5 mg by mouth 2 (two) times daily. 03/02/23  Yes [provider]  pantoprazole  (PROTONIX ) 40 MG tablet Take 40 mg by mouth daily. 06/12/17  Yes [provider]  albuterol  (PROVENTIL  HFA;VENTOLIN  HFA) 108 (90 Base) MCG/ACT inhaler Inhale 1-2 puffs into the lungs every 6 (six) hours as needed for wheezing or shortness of breath. Patient not taking: Reported on 07/15/2021 06/16/17   Wieters, Hallie C, PA-C  albuterol  (VENTOLIN  HFA) 108 (90 Base) MCG/ACT inhaler Inhale 2 puffs into the lungs every 4 (four) hours as needed for wheezing or shortness of breath. 02/18/21   Trifan, Janalyn Me, MD  amLODipine  (NORVASC ) 10 MG tablet Take 1 tablet (10 mg total) by mouth daily. 06/20/17 04/28/23  Cala Castleman, MD  celecoxib  (CELEBREX ) 200 MG capsule Take 200 mg by mouth 2 (two) times daily. Patient not taking: Reported on 04/28/2023 07/08/21   [provider]  fluticasone  (FLONASE ) 50 MCG/ACT nasal spray Place 1-2 sprays into both nostrils daily for 7 days. Patient taking differently: Place 1-2 sprays into  both nostrils daily as needed for allergies. 06/16/17 07/15/21  Wieters, Hallie C, PA-C  methocarbamol  (ROBAXIN -750) 750 MG tablet Take 1 tablet (750 mg total) by mouth every 8 (eight) hours as needed for muscle spasms. 08/06/21   Gawne, Meghan M, PA-C  montelukast  (SINGULAIR ) 10 MG tablet Take 1 tablet (10 mg total) by mouth daily. Patient not taking: Reported on 07/15/2021 06/20/17 06/20/18  Singh, Prashant K, MD  nabumetone  (RELAFEN ) 750 MG tablet Take 750 mg by mouth 2 (two) times daily as needed for  moderate pain. 05/31/21   [provider]  ondansetron  (ZOFRAN -ODT) 4 MG disintegrating tablet Take 1 tablet (4 mg total) by mouth 2 (two) times daily as needed for nausea or vomiting. 08/06/21   Gawne, Meghan M, PA-C  Oxycodone  HCl 10 MG TABS Take 0.5-1 tablets (5-10 mg total) by mouth every 8 (eight) hours as needed (for severe pain after surgery). Patient not taking: Reported on 04/28/2023 08/06/21   Gawne, Meghan M, PA-C  sertraline (ZOLOFT) 100 MG tablet Take 1 tablet by mouth daily. 04/20/23   [provider]  sildenafil (VIAGRA) 100 MG tablet Take 100 mg by mouth as needed. 11/30/22   [provider]  tiZANidine  (ZANAFLEX ) 4 MG tablet Take 4 mg by mouth every 6 (six) hours as needed. 11/30/22   [provider]  traZODone (DESYREL) 150 MG tablet Take 1 tablet by mouth at bedtime. 04/20/23   [provider]  TRULICITY  0.75 MG/0.5ML SOPN Inject 0.75 mg into the skin every Wednesday. Patient not taking: Reported on 04/28/2023 06/10/21   [provider]    Current Outpatient Medications  Medication Sig Dispense Refill   acetaminophen  (TYLENOL ) 500 MG tablet Take 2 tablets (1,000 mg total) by mouth every 6 (six) hours as needed for mild pain or moderate pain. 60 tablet 0   alprazolam  (XANAX ) 2 MG tablet Take 2 mg by mouth at bedtime.     alprazolam  (XANAX ) 2 MG tablet Take 1 tablet by mouth at bedtime.     aspirin  EC 81 MG tablet Take 1 tablet (81 mg total) by mouth 2 (two) times daily. To prevent blood clots for 30 days after surgery. 60 tablet 0   dorzolamide-timolol (COSOPT) 2-0.5 % ophthalmic solution Place 1 drop into both eyes 2 (two) times daily.     escitalopram  (LEXAPRO ) 10 MG tablet Take 10 mg by mouth at bedtime.     hydrochlorothiazide  (HYDRODIURIL ) 25 MG tablet Take 25 mg by mouth daily.     lamoTRIgine  (LAMICTAL ) 25 MG tablet Take 25 mg by mouth at bedtime.     latanoprost  (XALATAN ) 0.005 % ophthalmic solution Place 1 drop into both eyes  at bedtime.     losartan -hydrochlorothiazide  (HYZAAR) 100-12.5 MG tablet Take 1 tablet by mouth at bedtime.     meloxicam (MOBIC) 7.5 MG tablet Take 7.5 mg by mouth 2 (two) times daily.     pantoprazole  (PROTONIX ) 40 MG tablet Take 40 mg by mouth daily.     albuterol  (PROVENTIL  HFA;VENTOLIN  HFA) 108 (90 Base) MCG/ACT inhaler Inhale 1-2 puffs into the lungs every 6 (six) hours as needed for wheezing or shortness of breath. (Patient not taking: Reported on 07/15/2021) 1 Inhaler 0   albuterol  (VENTOLIN  HFA) 108 (90 Base) MCG/ACT inhaler Inhale 2 puffs into the lungs every 4 (four) hours as needed for wheezing or shortness of breath. 8 g 2   amLODipine  (NORVASC ) 10 MG tablet Take 1 tablet (10 mg total) by mouth daily. 30 tablet 11  celecoxib  (CELEBREX ) 200 MG capsule Take 200 mg by mouth 2 (two) times daily. (Patient not taking: Reported on 04/28/2023)     fluticasone  (FLONASE ) 50 MCG/ACT nasal spray Place 1-2 sprays into both nostrils daily for 7 days. (Patient taking differently: Place 1-2 sprays into both nostrils daily as needed for allergies.) 1 g 0   methocarbamol  (ROBAXIN -750) 750 MG tablet Take 1 tablet (750 mg total) by mouth every 8 (eight) hours as needed for muscle spasms. 20 tablet 0   montelukast  (SINGULAIR ) 10 MG tablet Take 1 tablet (10 mg total) by mouth daily. (Patient not taking: Reported on 07/15/2021) 30 tablet 11   nabumetone  (RELAFEN ) 750 MG tablet Take 750 mg by mouth 2 (two) times daily as needed for moderate pain.     ondansetron  (ZOFRAN -ODT) 4 MG disintegrating tablet Take 1 tablet (4 mg total) by mouth 2 (two) times daily as needed for nausea or vomiting. 10 tablet 0   Oxycodone  HCl 10 MG TABS Take 0.5-1 tablets (5-10 mg total) by mouth every 8 (eight) hours as needed (for severe pain after surgery). (Patient not taking: Reported on 04/28/2023) 21 tablet 0   sertraline (ZOLOFT) 100 MG tablet Take 1 tablet by mouth daily.     sildenafil (VIAGRA) 100 MG tablet Take 100 mg by mouth as  needed.     tiZANidine  (ZANAFLEX ) 4 MG tablet Take 4 mg by mouth every 6 (six) hours as needed.     traZODone (DESYREL) 150 MG tablet Take 1 tablet by mouth at bedtime.     TRULICITY  0.75 MG/0.5ML SOPN Inject 0.75 mg into the skin every Wednesday. (Patient not taking: Reported on 04/28/2023)     Current Facility-Administered Medications  Medication Dose Route Frequency Provider Last Rate Last Admin   0.9 %  sodium chloride  infusion  500 mL Intravenous Once Elois Hair, MD        Allergies as of 05/17/2023 - Review Complete 05/17/2023  Allergen Reaction Noted   Bee venom Swelling 06/16/2017    Family History  Problem Relation Age of Onset   Hypertension Mother    Hypertension Father    Colon cancer Neg Hx    Rectal cancer Neg Hx    Stomach cancer Neg Hx    Esophageal cancer Neg Hx     Social History   Socioeconomic History   Marital status: Married    Spouse name: Not on file   Number of children: Not on file   Years of education: Not on file   Highest education level: Not on file  Occupational History   Not on file  Tobacco Use   Smoking status: Former   Smokeless tobacco: Never  Vaping Use   Vaping status: Never Used  Substance and Sexual Activity   Alcohol use: Never   Drug use: Never   Sexual activity: Not on file  Other Topics Concern   Not on file  Social History Narrative   Not on file   Social Drivers of Health   Financial Resource Strain: Not on file  Food Insecurity: Not on file  Transportation Needs: Not on file  Physical Activity: Not on file  Stress: Not on file  Social Connections: Not on file  Intimate Partner Violence: Not on file    Review of Systems:  All other review of systems negative except as mentioned in the HPI.  Physical Exam: Vital signs BP (!) 146/82   Pulse 82   Temp 98.6 F (37 C)   Ht 6' (  1.829 m)   Wt (!) 303 lb (137.4 kg)   SpO2 98%   BMI 41.09 kg/m   General:   Alert,  Well-developed, well-nourished,  pleasant and cooperative in NAD Airway:  Mallampati 3 Lungs:  Clear throughout to auscultation.   Heart:  Regular rate and rhythm; no murmurs, clicks, rubs,  or gallops. Abdomen:  Soft, nontender and nondistended. Normal bowel sounds.   Neuro/Psych:  Normal mood and affect. A and O x 3   Amellia Panik E. Cherryl Corona, MD [facility]

## 2023-05-17 NOTE — Progress Notes (Signed)
 Called to room to assist during endoscopic procedure.  Patient ID and intended procedure confirmed with present staff. Received instructions for my participation in the procedure from the performing physician.

## 2023-05-17 NOTE — Patient Instructions (Signed)

## 2023-05-17 NOTE — Progress Notes (Signed)
 Patient reports no changes since pre visit

## 2023-05-17 NOTE — Progress Notes (Signed)
 Pt sedate, gd SR's, VSS, report to RN

## 2023-05-18 ENCOUNTER — Telehealth: Payer: Self-pay

## 2023-05-18 NOTE — Telephone Encounter (Signed)
  Follow up Call-     05/17/2023    1:37 PM  Call back number  Post procedure Call Back phone  # [phone]  Permission to leave phone message Yes     Patient questions:  Do you have a fever, pain , or abdominal swelling? No. Pain Score  0 *  Have you tolerated food without any problems? Yes.    Have you been able to return to your normal activities? Yes.    Do you have any questions about your discharge instructions: Diet   No. Medications  No. Follow up visit  No.  Do you have questions or concerns about your Care? No.  Actions: * If pain score is 4 or above: No action needed, pain <4.

## 2023-05-20 ENCOUNTER — Other Ambulatory Visit: Payer: Self-pay | Admitting: Orthopaedic Surgery

## 2023-05-20 DIAGNOSIS — M47817 Spondylosis without myelopathy or radiculopathy, lumbosacral region: Secondary | ICD-10-CM

## 2023-05-20 LAB — SURGICAL PATHOLOGY

## 2023-05-25 ENCOUNTER — Encounter: Payer: Self-pay | Admitting: Gastroenterology

## 2023-05-25 NOTE — Progress Notes (Signed)
 Jeremiah Lopez, Good news: the polyps that I removed during your recent examination were NOT precancerous.  You should continue to follow current colorectal cancer screening guidelines with a repeat colonoscopy in 10 years.    If you develop any new rectal bleeding, abdominal pain or significant bowel habit changes, please contact me before then.

## 2023-06-03 ENCOUNTER — Ambulatory Visit
Admission: RE | Admit: 2023-06-03 | Discharge: 2023-06-03 | Disposition: A | Discharge status: Home / Self Care | Source: Ambulatory Visit | Attending: Orthopaedic Surgery | Admitting: Orthopaedic Surgery

## 2023-06-03 DIAGNOSIS — M47817 Spondylosis without myelopathy or radiculopathy, lumbosacral region: Secondary | ICD-10-CM

## 2024-02-14 ENCOUNTER — Encounter: Payer: Self-pay | Admitting: Orthopaedic Surgery

## 2024-02-14 ENCOUNTER — Ambulatory Visit: Admitting: Orthopaedic Surgery

## 2024-02-14 ENCOUNTER — Other Ambulatory Visit (INDEPENDENT_AMBULATORY_CARE_PROVIDER_SITE_OTHER)

## 2024-02-14 DIAGNOSIS — M25552 Pain in left hip: Secondary | ICD-10-CM

## 2024-02-14 DIAGNOSIS — Z96642 Presence of left artificial hip joint: Secondary | ICD-10-CM | POA: Diagnosis not present

## 2024-02-14 NOTE — Progress Notes (Signed)
 The patient is a 64 year old gentleman who comes in for second opinion as a relates to hip pain.  One of my colleagues in town replaced his left hip back in July 2023.  He is most recently in August of this past year had significant lumbar spine surgery.  He does still ambulate with a cane.  He does report some right hip pain and still some left groin pain.  He did over 200 jumps in the [employer] and had a lot of wear-and-tear from this.  He also had a significant pelvis fracture to the left side back in 2006.  On exam his left operative hip moves smoothly and fluidly with no blocks or rotation and no pain on exam today at all.  His right hip actually moves pretty good with rotation but there is definitely pain in the groin of the right hip.  When I do have him lay in a supine position his leg lengths are entirely equal.  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  I did give him reassurance that his left total hip arthroplasty looks great.  He does wish to consider his right hip being replaced.  He is going to reach out to 2 the surgeon who performed his left total hip arthroplasty to see how about his right hip and I think this is absolutely reasonable given how well the hip is done the left side and how well the implants look that certainly having this done by the same surgeon who is satisfied with his absolutely reasonable and prudent.  I am happy to see him at any time but he is going to definitely reach out to my colleague about seeing him for his right hip.  He was reassured that the left hip replacement looks great.
